# Patient Record
Sex: Female | Born: 1990 | Race: Black or African American | Hispanic: No | Marital: Single | Smoking: Former smoker
Health system: Southern US, Community
[De-identification: ages and names within clinical notes are randomized; demographics above are authoritative.]

## PROBLEM LIST (undated history)

## (undated) DIAGNOSIS — G589 Mononeuropathy, unspecified: Secondary | ICD-10-CM

## (undated) DIAGNOSIS — F329 Major depressive disorder, single episode, unspecified: Secondary | ICD-10-CM

## (undated) DIAGNOSIS — F410 Panic disorder [episodic paroxysmal anxiety] without agoraphobia: Secondary | ICD-10-CM

## (undated) DIAGNOSIS — F32A Depression, unspecified: Secondary | ICD-10-CM

## (undated) DIAGNOSIS — F4310 Post-traumatic stress disorder, unspecified: Secondary | ICD-10-CM

## (undated) DIAGNOSIS — F419 Anxiety disorder, unspecified: Secondary | ICD-10-CM

## (undated) DIAGNOSIS — G4700 Insomnia, unspecified: Secondary | ICD-10-CM

## (undated) HISTORY — PX: THERAPEUTIC ABORTION: SHX798

---

## 2014-12-26 ENCOUNTER — Encounter (HOSPITAL_BASED_OUTPATIENT_CLINIC_OR_DEPARTMENT_OTHER): Payer: Self-pay | Admitting: *Deleted

## 2014-12-26 ENCOUNTER — Emergency Department (HOSPITAL_BASED_OUTPATIENT_CLINIC_OR_DEPARTMENT_OTHER)
Admission: EM | Admit: 2014-12-26 | Discharge: 2014-12-27 | Disposition: A | Discharge status: Home / Self Care | Payer: Medicaid Other | Attending: Emergency Medicine | Admitting: Emergency Medicine

## 2014-12-26 DIAGNOSIS — Z720 Tobacco use: Secondary | ICD-10-CM | POA: Insufficient documentation

## 2014-12-26 DIAGNOSIS — Z8659 Personal history of other mental and behavioral disorders: Secondary | ICD-10-CM | POA: Insufficient documentation

## 2014-12-26 DIAGNOSIS — K529 Noninfective gastroenteritis and colitis, unspecified: Secondary | ICD-10-CM | POA: Diagnosis not present

## 2014-12-26 DIAGNOSIS — R1010 Upper abdominal pain, unspecified: Secondary | ICD-10-CM | POA: Diagnosis present

## 2014-12-26 HISTORY — DX: Post-traumatic stress disorder, unspecified: F43.10

## 2014-12-26 HISTORY — DX: Panic disorder (episodic paroxysmal anxiety): F41.0

## 2014-12-26 MED ORDER — ONDANSETRON HCL 4 MG/2ML IJ SOLN
4.0000 mg | Freq: Once | INTRAMUSCULAR | Status: AC
  Administered 2014-12-26: 4 mg via INTRAVENOUS
  Filled 2014-12-26: qty 2

## 2014-12-26 MED ORDER — SODIUM CHLORIDE 0.9 % IV BOLUS (SEPSIS)
1000.0000 mL | Freq: Once | INTRAVENOUS | Status: AC
  Administered 2014-12-26: 1000 mL via INTRAVENOUS

## 2014-12-26 NOTE — ED Provider Notes (Signed)
CSN: [id number]     Arrival date & time 12/26/14  1850 History  This chart was scribed for Rolan BuccoMelanie Quatisha Zylka, MD by Richarda Overlieichard Holland, ED Scribe. This patient was seen in room MH09/MH09 and the patient's care was started 10:26 PM.    Chief Complaint  Patient presents with  . Abdominal Pain   HPI HPI Comments: Jane Black is a 24 y.o. female with a history of panic attacks who presents to the Emergency Department complaining of upper abdominal pain that started at 7AM this morning. Pt reports associated nausea, vomiting and diarrhea. 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 Pt reports she is nauseated currently. She states her period started this morning and says she normally does not have pain with her periods. She denies any abdominal surgeries. She denies fever.    Past Medical History  Diagnosis Date  . PTSD (post-traumatic stress disorder)   . Panic attacks    History reviewed. No pertinent past surgical history. No family history on file. History  Substance Use Topics  . Smoking status: Current Some Day Smoker -- 1 years    Types: Cigars  . Smokeless tobacco: Not on file  . Alcohol Use: Not on file   OB History    No data available     Review of Systems  Constitutional: Positive for chills. Negative for fever, diaphoresis and fatigue.  HENT: Negative for congestion, rhinorrhea and sneezing.   Eyes: Negative.   Respiratory: Negative for cough, chest tightness and shortness of breath.   Cardiovascular: Negative for chest pain and leg swelling.  Gastrointestinal: Positive for nausea, vomiting, abdominal pain and diarrhea. Negative for blood in stool.  Genitourinary: Positive for vaginal bleeding (she is on her period now). Negative for frequency, hematuria, flank pain and difficulty urinating.  Musculoskeletal: Negative for back pain and arthralgias.  Skin: Negative for rash.  Neurological: Negative for dizziness, speech difficulty,  weakness, numbness and headaches.  All other systems reviewed and are negative.     Allergies  Review of patient's allergies indicates no known allergies.  Home Medications   Prior to Admission medications   Medication Sig Start Date End Date Taking? Authorizing Provider  ondansetron (ZOFRAN ODT) 4 MG disintegrating tablet 4mg  ODT q4 hours prn nausea/vomit 12/27/14   Rolan BuccoMelanie Shankar Silber, MD   BP 130/69 mmHg  Pulse 62  Temp(Src) 98.4 F (36.9 C) (Oral)  Resp 16  Ht 5\' 6"  (1.676 m)  Wt 190 lb (86.183 kg)  BMI 30.68 kg/m2  SpO2 100%  LMP 12/26/2014 Physical Exam  Constitutional: She is oriented to person, place, and time. She appears well-developed and well-nourished.  HENT:  Head: Normocephalic and atraumatic.  Eyes: Pupils are equal, round, and reactive to light. Right eye exhibits no discharge. Left eye exhibits no discharge.  Neck: Normal range of motion. Neck supple. No tracheal deviation present.  Cardiovascular: Normal rate, regular rhythm and normal heart sounds.   Pulmonary/Chest: Effort normal and breath sounds normal. No respiratory distress. She has no wheezes. She has no rales. She exhibits no tenderness.  Abdominal: Soft. Bowel sounds are normal. She exhibits no distension. There is tenderness (Mild diffuse tenderness). There is no rebound and no guarding.  Musculoskeletal: Normal range of motion. She exhibits no edema.  Lymphadenopathy:    She has no cervical adenopathy.  Neurological: She is alert and oriented to person, place, and time.  Skin: Skin is warm and dry. No rash noted.  Psychiatric: She has  a normal mood and affect. Her behavior is normal.  Nursing note and vitals reviewed.   ED Course  Procedures   DIAGNOSTIC STUDIES: Oxygen Saturation is 100% on RA, normal by my interpretation.    COORDINATION OF CARE: 10:31 PM Discussed treatment plan with pt at bedside and pt agreed to plan.   Labs Review Labs Reviewed  URINALYSIS, ROUTINE W REFLEX  MICROSCOPIC  PREGNANCY, URINE    Imaging Review No results found.   EKG Interpretation None      MDM   Final diagnoses:  Gastroenteritis   Patient is given IV fluids and Zofran. She is feeling much better after this. Her abdomen exam is non-concerning. She's completely nontender on exam now. She has no ongoing vomiting. She's tolerating by mouth fluids. Her symptoms are consistent with a viral gastroenteritis. The labs are not crossing over but her urine pregnancy was negative and her urinalysis did not show evidence of infection. There was some red cells in her urine which is consistent with her being on her period. She was discharged home with a prescription for some Zofran. She was advised to return if she has worsening symptoms.  I personally performed the services described in this documentation, which was scribed in my presence.  The recorded information has been reviewed and considered.      Rolan Bucco, MD 12/27/14 [id number]

## 2014-12-26 NOTE — ED Notes (Signed)
C/o upper abd pain with n/v/d. Onset this am with chills and sweats.

## 2014-12-27 MED ORDER — ONDANSETRON 4 MG PO TBDP: ORAL_TABLET | ORAL | Status: DC

## 2014-12-27 NOTE — Discharge Instructions (Signed)

## 2014-12-27 NOTE — ED Notes (Signed)
Dr. Belfi at BS 

## 2015-03-13 ENCOUNTER — Encounter (HOSPITAL_BASED_OUTPATIENT_CLINIC_OR_DEPARTMENT_OTHER): Payer: Self-pay | Admitting: *Deleted

## 2015-03-13 ENCOUNTER — Emergency Department (HOSPITAL_BASED_OUTPATIENT_CLINIC_OR_DEPARTMENT_OTHER)
Admission: EM | Admit: 2015-03-13 | Discharge: 2015-03-13 | Disposition: A | Discharge status: Home / Self Care | Payer: Medicaid Other | Attending: Emergency Medicine | Admitting: Emergency Medicine

## 2015-03-13 ENCOUNTER — Emergency Department (HOSPITAL_BASED_OUTPATIENT_CLINIC_OR_DEPARTMENT_OTHER): Payer: Medicaid Other

## 2015-03-13 DIAGNOSIS — Z720 Tobacco use: Secondary | ICD-10-CM | POA: Insufficient documentation

## 2015-03-13 DIAGNOSIS — N939 Abnormal uterine and vaginal bleeding, unspecified: Secondary | ICD-10-CM | POA: Diagnosis not present

## 2015-03-13 DIAGNOSIS — Z8659 Personal history of other mental and behavioral disorders: Secondary | ICD-10-CM | POA: Insufficient documentation

## 2015-03-13 HISTORY — DX: Anxiety disorder, unspecified: F41.9

## 2015-03-13 LAB — HCG, QUANTITATIVE, PREGNANCY: HCG, BETA CHAIN, QUANT, S: 949 m[IU]/mL — AB (ref <5)

## 2015-03-13 LAB — CBC WITH DIFFERENTIAL/PLATELET
Basophils Absolute: 0 10*3/uL (ref 0.0–0.1)
Basophils Relative: 0 % (ref 0–1)
Eosinophils Absolute: 0 10*3/uL (ref 0.0–0.7)
Eosinophils Relative: 0 % (ref 0–5)
HEMATOCRIT: 33.8 % — AB (ref 36.0–46.0)
HEMOGLOBIN: 11 g/dL — AB (ref 12.0–15.0)
LYMPHS ABS: 2.6 10*3/uL (ref 0.7–4.0)
Lymphocytes Relative: 26 % (ref 12–46)
MCH: 30.2 pg (ref 26.0–34.0)
MCHC: 32.5 g/dL (ref 30.0–36.0)
MCV: 92.9 fL (ref 78.0–100.0)
Monocytes Absolute: 0.5 10*3/uL (ref 0.1–1.0)
Monocytes Relative: 5 % (ref 3–12)
Neutro Abs: 6.8 10*3/uL (ref 1.7–7.7)
Neutrophils Relative %: 69 % (ref 43–77)
PLATELETS: 287 10*3/uL (ref 150–400)
RBC: 3.64 MIL/uL — AB (ref 3.87–5.11)
RDW: 13.6 % (ref 11.5–15.5)
WBC: 9.9 10*3/uL (ref 4.0–10.5)

## 2015-03-13 LAB — BASIC METABOLIC PANEL
ANION GAP: 7 (ref 5–15)
BUN: 7 mg/dL (ref 6–20)
CO2: 26 mmol/L (ref 22–32)
CREATININE: 0.48 mg/dL (ref 0.44–1.00)
Calcium: 8.8 mg/dL — ABNORMAL LOW (ref 8.9–10.3)
Chloride: 105 mmol/L (ref 101–111)
GFR calc non Af Amer: >60 mL/min (ref >60)
Glucose, Bld: 92 mg/dL (ref 65–99)
POTASSIUM: 3.7 mmol/L (ref 3.5–5.1)
Sodium: 138 mmol/L (ref 135–145)

## 2015-03-13 MED ORDER — IBUPROFEN 800 MG PO TABS
800.0000 mg | ORAL_TABLET | Freq: Once | ORAL | Status: AC
  Administered 2015-03-13: 800 mg via ORAL
  Filled 2015-03-13: qty 1

## 2015-03-13 NOTE — ED Provider Notes (Signed)
CSN: [id number]     Arrival date & time 03/13/15  4098 History   First MD Initiated Contact with Patient 03/13/15 1919     Chief Complaint  Patient presents with  . Vaginal Bleeding    Patient is a 24 y.o. female presenting with vaginal bleeding. The history is provided by the patient.  Vaginal Bleeding Severity:  Moderate Onset quality:  Gradual Duration:  1 day Timing:  Intermittent Progression:  Worsening Chronicity:  New Relieved by:  Nothing Worsened by:  Nothing tried Associated symptoms: abdominal pain   Associated symptoms: no fever   Risk factors comment:  Recent elective abortion pt reports elective abortion last week She reports over past day she has had increased vag bleeding No fever/vomiting reported  Pt reports she was approximately 12 weeks She reports procedure was in [place] and she has no f/u arranged there Past Medical History  Diagnosis Date  . PTSD (post-traumatic stress disorder)   . Panic attacks   . Anxiety    Past Surgical History  Procedure Laterality Date  . Therapeutic abortion     No family history on file. History  Substance Use Topics  . Smoking status: Current Some Day Smoker -- 1 years    Types: Cigars  . Smokeless tobacco: Never Used  . Alcohol Use: Yes     Comment: rare   OB History    No data available     Review of Systems  Constitutional: Negative for fever.  Gastrointestinal: Positive for abdominal pain. Negative for vomiting.  Genitourinary: Positive for vaginal bleeding.  Neurological: Negative for weakness.  All other systems reviewed and are negative.     Allergies  Review of patient's allergies indicates no known allergies.  Home Medications   Prior to Admission medications   Medication Sig Start Date End Date Taking? Authorizing Provider  ondansetron (ZOFRAN ODT) 4 MG disintegrating tablet  ODT q4 hours prn nausea/vomit 12/27/14   Rolan Bucco, MD   BP 136/81 mmHg  Pulse 93  Temp(Src) 98.6 F (37 C)  (Oral)  Resp 18  Ht  (1.676 m)  Wt 173 lb (78.472 kg)  BMI 27.94 kg/m2  SpO2 100% Physical Exam CONSTITUTIONAL: Well developed/well nourished HEAD: Normocephalic/atraumatic EYES: EOMI/PERRL ENMT: Mucous membranes moist NECK: supple no meningeal signs SPINE/BACK:entire spine nontender CV: S1/S2 noted, no murmurs/rubs/gallops noted LUNGS: Lungs are clear to auscultation bilaterally, no apparent distress ABDOMEN: soft, nontender, no rebound or guarding, bowel sounds noted throughout abdomen GU:no cva tenderness. Vaginal bleeding noted.  No products of conception noted.  Cervical os closed.  Chaperone present (nurse Kaila) NEURO: Pt is awake/alert/appropriate, moves all extremitiesx4.  No facial droop.   EXTREMITIES: pulses normal/equal, full ROM SKIN: warm, color normal PSYCH: no abnormalities of mood noted, alert and oriented to situation  ED Course  Procedures  8:35 PM Will need pelvic US to r/o retained products of conception 9:54 PM PT IMPROVED SHE IS AMBULATING, NO DISTRESS DISCUSSED US FINDINGS WITH HER OBYN DR DORN RECOMMENDS F/U IN OFFICE THIS WEEK FOR REPEAT HCG LEVELS AT THIS TIME, DOES NOT REQUIRE EMERGENT OPERATIVE MANAGEMENT DISCUSSED STRICT RETURN PRECAUTIONS  Labs Review Labs Reviewed  BASIC METABOLIC PANEL - Abnormal; Notable for the following:    Calcium 8.8 (*)    All other components within normal limits  CBC WITH DIFFERENTIAL/PLATELET - Abnormal; Notable for the following:    RBC 3.64 (*)    Hemoglobin 11.0 (*)    HCT 33.8 (*)    All other components  within normal limits  HCG, QUANTITATIVE, PREGNANCY - Abnormal; Notable for the following:    hCG, Beta Chain, Quant, S 949 (*)    All other components within normal limits    Imaging Review [place] Transvaginal Non-ob  03/13/2015   CLINICAL DATA:  Initial evaluation for heavy vaginal bleeding status post recent elective abortion.  EXAM: TRANSABDOMINAL AND TRANSVAGINAL ULTRASOUND OF PELVIS  TECHNIQUE:  Both transabdominal and transvaginal ultrasound examinations of the pelvis were performed. Transabdominal technique was performed for global imaging of the pelvis including uterus, ovaries, adnexal regions, and pelvic cul-de-sac. It was necessary to proceed with endovaginal exam following the transabdominal exam to visualize the uterus and ovaries.  COMPARISON:  None  FINDINGS: Uterus  Measurements: 8.0 x 5.0 x 5.9 cm. Uterus is retroverted. No fibroids or focal masses identified.  Endometrium  Thickness: 2.1 cm. There is heterogeneous predominantly hypoechoic complex material within the endometrial canal. No definite internal vascularity seen with color Doppler imaging.  Right ovary  Measurements: 2.7 x 2.0 x 2.2 cm. Normal appearance/no adnexal mass. Normal 1.4 x 1.1 x 1.2 cm follicle noted.  Left ovary  Measurements: 2.4 x 1.3 x 1.6 cm. Normal appearance/no adnexal mass.  Other findings  No free fluid.  IMPRESSION: 1. Complex heterogeneous predominantly hypoechoic material within the endometrial canal without internal vascularity. Given the imaging appearance and history vaginal bleeding, finding is favored to most likely reflect retained blood products. However, while no associated internal vascularity is identified, possible retained products of conception is not entirely excluded. Correlation with serial beta HCG levels and gynecologic consultation recommended. 2. Normal sonographic appearance of the ovaries.   Electronically Signed   By: Rise MuBenjamin  McClintock M.D.   On: 03/13/2015 21:23   [place] Pelvis Complete  03/13/2015   CLINICAL DATA:  Initial evaluation for heavy vaginal bleeding status post recent elective abortion.  EXAM: TRANSABDOMINAL AND TRANSVAGINAL ULTRASOUND OF PELVIS  TECHNIQUE: Both transabdominal and transvaginal ultrasound examinations of the pelvis were performed. Transabdominal technique was performed for global imaging of the pelvis including uterus, ovaries, adnexal regions, and pelvic  cul-de-sac. It was necessary to proceed with endovaginal exam following the transabdominal exam to visualize the uterus and ovaries.  COMPARISON:  None  FINDINGS: Uterus  Measurements: 8.0 x 5.0 x 5.9 cm. Uterus is retroverted. No fibroids or focal masses identified.  Endometrium  Thickness: 2.1 cm. There is heterogeneous predominantly hypoechoic complex material within the endometrial canal. No definite internal vascularity seen with color Doppler imaging.  Right ovary  Measurements: 2.7 x 2.0 x 2.2 cm. Normal appearance/no adnexal mass. Normal 1.4 x 1.1 x 1.2 cm follicle noted.  Left ovary  Measurements: 2.4 x 1.3 x 1.6 cm. Normal appearance/no adnexal mass.  Other findings  No free fluid.  IMPRESSION: 1. Complex heterogeneous predominantly hypoechoic material within the endometrial canal without internal vascularity. Given the imaging appearance and history vaginal bleeding, finding is favored to most likely reflect retained blood products. However, while no associated internal vascularity is identified, possible retained products of conception is not entirely excluded. Correlation with serial beta HCG levels and gynecologic consultation recommended. 2. Normal sonographic appearance of the ovaries.   Electronically Signed   By: Rise MuBenjamin  McClintock M.D.   On: 03/13/2015 21:23      MDM   Final diagnoses:  Vaginal bleeding    Nursing notes including past medical history and social history reviewed and considered in documentation Labs/vital reviewed myself and considered during evaluation     Zadie Rhineonald Loetta Connelley, MD 03/13/15  2157 

## 2015-03-13 NOTE — Discharge Instructions (Signed)
[facility] AND FOLLOWUP IN 2 DAYS FOR REPEAT BLOOD HCG LEVELS (CURRENT LEVEL IS 949)   Abnormal Uterine Bleeding Abnormal uterine bleeding can affect women at various stages in life, including teenagers, women in their reproductive years, pregnant women, and women who have reached menopause. Several kinds of uterine bleeding are considered abnormal, including:  Bleeding or spotting between periods.   Bleeding after sexual intercourse.   Bleeding that is heavier or more than normal.   Periods that last longer than usual.  Bleeding after menopause.  Many cases of abnormal uterine bleeding are minor and simple to treat, while others are more serious. Any type of abnormal bleeding should be evaluated by your health care provider. Treatment will depend on the cause of the bleeding. HOME CARE INSTRUCTIONS Monitor your condition for any changes. The following actions may help to alleviate any discomfort you are experiencing:  Avoid the use of tampons and douches as directed by your health care provider.  Change your pads frequently. You should get regular pelvic exams and Pap tests. Keep all follow-up appointments for diagnostic tests as directed by your health care provider.  SEEK MEDICAL CARE IF:   Your bleeding lasts more than 1 week.   You feel dizzy at times.  SEEK IMMEDIATE MEDICAL CARE IF:   You pass out.   You are changing pads every 15 to 30 minutes.   You have abdominal pain.  You have a fever.   You become sweaty or weak.   You are passing large blood clots from the vagina.   You start to feel nauseous and vomit. MAKE SURE YOU:   Understand these instructions.  Will watch your condition.  Will get help right away if you are not doing well or get worse. Document Released: 10/08/2005 Document Revised: 10/13/2013 Document Reviewed: 05/07/2013 [facility] Patient Information 2015 [employer] [place]. This information is not intended to replace advice  given to you by your health care provider. Make sure you discuss any questions you have with your health care provider.

## 2015-03-13 NOTE — ED Notes (Signed)
Pt reports she had surgical abortion on Thursday- states she has been passing blood clots since last night

## 2015-03-13 NOTE — ED Notes (Signed)
Pelvic cart at bedside. 

## 2016-09-18 ENCOUNTER — Encounter (HOSPITAL_BASED_OUTPATIENT_CLINIC_OR_DEPARTMENT_OTHER): Payer: Self-pay | Admitting: Emergency Medicine

## 2016-09-18 ENCOUNTER — Emergency Department (HOSPITAL_BASED_OUTPATIENT_CLINIC_OR_DEPARTMENT_OTHER)
Admission: EM | Admit: 2016-09-18 | Discharge: 2016-09-18 | Disposition: A | Discharge status: Home / Self Care | Payer: Medicaid Other | Attending: Emergency Medicine | Admitting: Emergency Medicine

## 2016-09-18 DIAGNOSIS — F410 Panic disorder [episodic paroxysmal anxiety] without agoraphobia: Secondary | ICD-10-CM | POA: Insufficient documentation

## 2016-09-18 DIAGNOSIS — F17290 Nicotine dependence, other tobacco product, uncomplicated: Secondary | ICD-10-CM | POA: Insufficient documentation

## 2016-09-18 HISTORY — DX: Depression, unspecified: F32.A

## 2016-09-18 HISTORY — DX: Mononeuropathy, unspecified: G58.9

## 2016-09-18 HISTORY — DX: Insomnia, unspecified: G47.00

## 2016-09-18 HISTORY — DX: Major depressive disorder, single episode, unspecified: F32.9

## 2016-09-18 MED ORDER — HYDROXYZINE HCL 25 MG PO TABS: 25.0000 mg | ORAL_TABLET | Freq: Four times a day (QID) | ORAL | 0 refills | Status: DC

## 2016-09-18 MED ORDER — LORAZEPAM 1 MG PO TABS
1.0000 mg | ORAL_TABLET | Freq: Once | ORAL | Status: AC
  Administered 2016-09-18: 1 mg via ORAL
  Filled 2016-09-18: qty 1

## 2016-09-18 MED FILL — hydrOXYzine HCL 25 MG TABS: 25 | 3 days supply | Qty: 12 | Fill #0

## 2016-09-18 NOTE — ED Provider Notes (Signed)
[facility]   CSN: [id number] Arrival date & time: 09/18/16  1116     History   Chief Complaint Chief Complaint  Patient presents with  . Anxiety    HPI Jane Black is a 25 y.o. female.  HPI Jane Black is a 25 y.o. female with PMH significant for anxiety, depression, panic attacks, PTSD who presents with anxiety. Patient states she experienced one of her typical panic attacks yesterday after work that has since resolved. She states it may be due to decreased sleep. She states she has a history of insomnia as well, and has not been able to sleep quite as well since her work requires her to wake up at 5 AM. She has otherwise been in her usual state of health. She denies any suicidal or homicidal ideations. She used to be on daily medication for her anxiety, but this was stopped when she became pregnant, and she is not restarted any maintenance medications. She states her last panic attack was about 2 years ago when she was pregnant. She denies any drug or alcohol use. She denies any chest pain, fever, shortness of breath, cough, or abdominal pain.   Past Medical History:  Diagnosis Date  . Anxiety   . Depression   . Insomnia   . Nerve disorder   . Panic attacks   . PTSD (post-traumatic stress disorder)     There are no active problems to display for this patient.   Past Surgical History:  Procedure Laterality Date  . THERAPEUTIC ABORTION      OB History    No data available       Home Medications    Prior to Admission medications   Medication Sig Start Date End Date Taking? Authorizing Provider  hydrOXYzine (ATARAX/VISTARIL) 25 MG tablet Take 1 tablet (25 mg total) by mouth every 6 (six) hours. 09/18/16   Cheri FowlerKayla Slyvia Lartigue, PA-C  ondansetron (ZOFRAN ODT) 4 MG disintegrating tablet 4mg  ODT q4 hours prn nausea/vomit 12/27/14   Rolan BuccoMelanie Belfi, MD    Family History History reviewed. No pertinent family history.  Social History Social  History  Substance Use Topics  . Smoking status: Current Some Day Smoker    Years: 1.00    Types: Cigars  . Smokeless tobacco: Never Used  . Alcohol use Yes     Comment: rare     Allergies   Patient has no known allergies.   Review of Systems Review of Systems All other systems negative unless otherwise stated in HPI   Physical Exam Updated Vital Signs BP 122/77 (BP Location: Left Arm)   Pulse 72   Temp 98.2 F (36.8 C) (Oral)   Resp 18   Ht 5\' 7"  (1.702 m)   Wt 85.7 kg   LMP 09/16/2016 (Exact Date)   SpO2 100%   BMI 29.60 kg/m   Physical Exam  Constitutional: She is oriented to person, place, and time. She appears well-developed and well-nourished.  Non-toxic appearance. She does not have a sickly appearance. She does not appear ill.  HENT:  Head: Normocephalic and atraumatic.  Mouth/Throat: Oropharynx is clear and moist.  Eyes: Conjunctivae are normal.  Neck: Normal range of motion. Neck supple.  Cardiovascular: Normal rate, regular rhythm and normal heart sounds.   Pulmonary/Chest: Effort normal and breath sounds normal. No accessory muscle usage or stridor. No respiratory distress. She has no wheezes. She has no rhonchi. She has no rales.  Abdominal: Soft. Bowel sounds are normal. She exhibits no  distension. There is no tenderness.  Musculoskeletal: Normal range of motion.  Lymphadenopathy:    She has no cervical adenopathy.  Neurological: She is alert and oriented to person, place, and time.  Speech clear without dysarthria.  Skin: Skin is warm and dry.  Psychiatric: She has a normal mood and affect. Her behavior is normal.     ED Treatments / Results  Labs (all labs ordered are listed, but only abnormal results are displayed) Labs Reviewed - No data to display  EKG  EKG Interpretation None       Radiology No results found.  Procedures Procedures (including critical care time)  Medications Ordered in ED Medications  LORazepam (ATIVAN)  tablet 1 mg (1 mg Oral Given 09/18/16 1213)     Initial Impression / Assessment and Plan / ED Course  I have reviewed the triage vital signs and the nursing notes.  Pertinent labs & imaging results that were available during my care of the patient were reviewed by me and considered in my medical decision making (see chart for details).  Clinical Course    Patient presents with her typical panic attack and anxiety. No systemic symptoms. She is in her usual state of health. She expresses no suicidal or homicidal ideations. I do not suspect an emergent cause. Patient treated in ED with by mouth Ativan. Discharged home with Atarax. Encouraged patient to follow up with her behavior health team. Return precautions discussed. Stable for discharge.  Final Clinical Impressions(s) / ED Diagnoses   Final diagnoses:  Panic attack    New Prescriptions Discharge Medication List as of 09/18/2016 12:26 PM    START taking these medications   Details  hydrOXYzine (ATARAX/VISTARIL) 25 MG tablet Take 1 tablet (25 mg total) by mouth every 6 (six) hours., Starting Tue 09/18/2016, Print         Cheri FowlerKayla Jase Reep, PA-C 09/18/16 1653    Azalia BilisKevin Campos, MD 09/20/16 [phone]

## 2016-09-18 NOTE — Discharge Instructions (Signed)
Please follow up with your primary care/behavioral health provider to possibly get restarted on a daily anxiety medication.  Return to the ED for sudden worsening symptoms, chest pain, shortness of breath, fever, or any new or concerning symptoms.

## 2016-09-18 NOTE — ED Triage Notes (Signed)
Pt sts "I'm having anxiety"; sts started yesterday during/after work; denies specific trigger

## 2016-09-18 NOTE — ED Notes (Signed)
Pt d/c home with ride. Work note given. Directed to pharmacy to pick up Rx. Ambulatory to d/c window with steady gait

## 2017-05-21 ENCOUNTER — Encounter (HOSPITAL_BASED_OUTPATIENT_CLINIC_OR_DEPARTMENT_OTHER): Payer: Self-pay | Admitting: Emergency Medicine

## 2017-05-21 ENCOUNTER — Emergency Department (HOSPITAL_BASED_OUTPATIENT_CLINIC_OR_DEPARTMENT_OTHER)
Admission: EM | Admit: 2017-05-21 | Discharge: 2017-05-21 | Disposition: A | Discharge status: Home / Self Care | Payer: Medicaid Other | Attending: Emergency Medicine | Admitting: Emergency Medicine

## 2017-05-21 DIAGNOSIS — M779 Enthesopathy, unspecified: Secondary | ICD-10-CM

## 2017-05-21 DIAGNOSIS — M778 Other enthesopathies, not elsewhere classified: Secondary | ICD-10-CM | POA: Insufficient documentation

## 2017-05-21 DIAGNOSIS — Z79899 Other long term (current) drug therapy: Secondary | ICD-10-CM | POA: Insufficient documentation

## 2017-05-21 DIAGNOSIS — F17210 Nicotine dependence, cigarettes, uncomplicated: Secondary | ICD-10-CM | POA: Insufficient documentation

## 2017-05-21 MED ORDER — IBUPROFEN 600 MG PO TABS: 600.0000 mg | ORAL_TABLET | Freq: Four times a day (QID) | ORAL | 0 refills | Status: DC | PRN

## 2017-05-21 NOTE — ED Triage Notes (Signed)
pateint reports that she started to have right hand pain yesterday. History of the same when she was pregnant 2 -3 years ago

## 2017-05-21 NOTE — ED Notes (Signed)
ED Provider at bedside. 

## 2017-05-21 NOTE — Discharge Instructions (Signed)
Wear wrist splint particularly during activities.  Try to elevate and ice her wrist and hand several times per day.  Take ibuprofen for inflammation and pain.  Follow-up with a hand specialist (name provided in discharge instructions) if symptoms are not improving with 1-2 weeks of treatment.

## 2017-05-21 NOTE — ED Provider Notes (Signed)
[facility] Provider [facility]   CSN: [id number] Arrival date & time: 05/21/17  1629     History   Chief Complaint Chief Complaint  Patient presents with  . Hand Pain    HPI Jane Black is a 26 y.o. female.  HPI Patient has had problems with right handed palmnar pain and wrist pain since being pregnant about 3 years ago. She reports that at that time she was told it was a tendinitis. It never really resolved. 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 She illustrates a motion that requires her to rapidly pronate and supinate while holding an ice cream container. The patient reports that the other problem she has with that hand, is that it will cramp up and she has to manually open it if she has closed into a fist to grip something . She states that happens if she writes for very long or sometimes while she is brushing her hair. This does not happen in the opposite hand. She does not have other cramping. She reports that she does not think she is pregnant. LMP was last week. Review systems otherwise negative. Past Medical History:  Diagnosis Date  . Anxiety   . Depression   . Insomnia   . Nerve disorder   . Panic attacks   . PTSD (post-traumatic stress disorder)     There are no active problems to display for this patient.   Past Surgical History:  Procedure Laterality Date  . THERAPEUTIC ABORTION      OB History    No data available       Home Medications    Prior to Admission medications   Medication Sig Start Date End Date Taking? Authorizing Provider  hydrOXYzine (ATARAX/VISTARIL) 25 MG tablet Take 1 tablet (25 mg total) by mouth every 6 (six) hours. 09/18/16   Cheri Fowlerose, Kayla, PA-C  ibuprofen (ADVIL,MOTRIN) 600 MG tablet Take 1 tablet (600 mg total) by mouth every 6 (six) hours as needed. 05/21/17   Arby BarrettePfeiffer, Shawnmichael Parenteau, MD  ondansetron (ZOFRAN ODT) 4 MG disintegrating tablet 4mg  ODT q4 hours prn nausea/vomit 12/27/14    Rolan BuccoBelfi, Melanie, MD    Family History History reviewed. No pertinent family history.  Social History Social History  Substance Use Topics  . Smoking status: Current Some Day Smoker    Years: 1.00    Types: Cigars  . Smokeless tobacco: Never Used  . Alcohol use Yes     Comment: rare     Allergies   Patient has no known allergies.   Review of Systems Review of Systems 10 Systems reviewed and are negative for acute change except as noted in the HPI.   Physical Exam Updated Vital Signs BP 133/79 (BP Location: Left Arm)   Pulse 97   Temp 99.3 F (37.4 C) (Oral)   Resp 18   Ht 5\' 6"  (1.676 m)   Wt 79.4 kg (175 lb)   LMP 05/14/2017   SpO2 100%   BMI 28.25 kg/m   Physical Exam  Constitutional: She is oriented to person, place, and time. She appears well-developed and well-nourished. No distress.  Patient is clinically well and appearance. She is well-nourished well-developed. Mildly overweight but not significant obesity.  HENT:  Head: Normocephalic and atraumatic.  Eyes: EOM are normal.  Pulmonary/Chest: Effort normal.  Musculoskeletal: Normal range of motion. She exhibits no edema, tenderness or deformity.  Patient's right hand and upper extremity do not have any objective swelling. Patient is  nontender at the elbow with normal range of motion. No forearm tenderness. Wrist is normal in appearance. Patient has not has significant tenderness over the carpal tunnel. Patient's only reproducible tenderness is with lateral compression of the palm by squeezing the medical carpals together. Patient has normal range of motion of the digits. Patient has normal resistance against forced flexion and extension. Patient does not have pain with forced extension of the wrist.  Neurological: She is alert and oriented to person, place, and time. No cranial nerve deficit. She exhibits normal muscle tone. Coordination normal.  Skin: Skin is warm and dry.  Psychiatric: She has a normal mood  and affect.     ED Treatments / Results  Labs (all labs ordered are listed, but only abnormal results are displayed) Labs Reviewed - No data to display  EKG  EKG Interpretation None       Radiology No results found.  Procedures Procedures (including critical care time)  Medications Ordered in ED Medications - No data to display   Initial Impression / Assessment and Plan / ED Course  I have reviewed the triage vital signs and the nursing notes.  Pertinent labs & imaging results that were available during my care of the patient were reviewed by me and considered in my medical decision making (see chart for details).     Final Clinical Impressions(s) / ED Diagnoses   Final diagnoses:  Right hand tendonitis   At this time, patient does not have significant objective findings regarding her symptoms. She has normal range of motion of the digits without any contraction. She does have intact normal range of motion. She does have intact strength of all tendon testing of the hands. 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 At this time will recommend she wears a wrist splint, elevate and ice is in the evenings and tries anti-inflammatory. He is counseled on the importance of follow-up with hand specialist in 1-2 weeks if symptoms are not significantly improved with conservative treatment. New Prescriptions New Prescriptions   IBUPROFEN (ADVIL,MOTRIN) 600 MG TABLET    Take 1 tablet (600 mg total) by mouth every 6 (six) hours as needed.     Arby BarrettePfeiffer, Iren Whipp, MD 05/21/17 1726

## 2018-01-05 ENCOUNTER — Emergency Department (HOSPITAL_BASED_OUTPATIENT_CLINIC_OR_DEPARTMENT_OTHER): Payer: Medicaid Other

## 2018-01-05 ENCOUNTER — Emergency Department (HOSPITAL_BASED_OUTPATIENT_CLINIC_OR_DEPARTMENT_OTHER)
Admission: EM | Admit: 2018-01-05 | Discharge: 2018-01-05 | Disposition: A | Discharge status: Home / Self Care | Payer: Medicaid Other | Attending: Emergency Medicine | Admitting: Emergency Medicine

## 2018-01-05 ENCOUNTER — Other Ambulatory Visit: Payer: Self-pay

## 2018-01-05 ENCOUNTER — Encounter (HOSPITAL_BASED_OUTPATIENT_CLINIC_OR_DEPARTMENT_OTHER): Payer: Self-pay | Admitting: *Deleted

## 2018-01-05 DIAGNOSIS — F17290 Nicotine dependence, other tobacco product, uncomplicated: Secondary | ICD-10-CM | POA: Insufficient documentation

## 2018-01-05 DIAGNOSIS — R102 Pelvic and perineal pain: Secondary | ICD-10-CM | POA: Diagnosis not present

## 2018-01-05 DIAGNOSIS — O23591 Infection of other part of genital tract in pregnancy, first trimester: Secondary | ICD-10-CM | POA: Insufficient documentation

## 2018-01-05 DIAGNOSIS — O99331 Smoking (tobacco) complicating pregnancy, first trimester: Secondary | ICD-10-CM | POA: Diagnosis not present

## 2018-01-05 DIAGNOSIS — B9689 Other specified bacterial agents as the cause of diseases classified elsewhere: Secondary | ICD-10-CM

## 2018-01-05 DIAGNOSIS — Z3A01 Less than 8 weeks gestation of pregnancy: Secondary | ICD-10-CM | POA: Diagnosis not present

## 2018-01-05 DIAGNOSIS — O9989 Other specified diseases and conditions complicating pregnancy, childbirth and the puerperium: Secondary | ICD-10-CM | POA: Diagnosis present

## 2018-01-05 DIAGNOSIS — A5901 Trichomonal vulvovaginitis: Secondary | ICD-10-CM | POA: Insufficient documentation

## 2018-01-05 DIAGNOSIS — O219 Vomiting of pregnancy, unspecified: Secondary | ICD-10-CM

## 2018-01-05 DIAGNOSIS — O218 Other vomiting complicating pregnancy: Secondary | ICD-10-CM | POA: Insufficient documentation

## 2018-01-05 DIAGNOSIS — N760 Acute vaginitis: Secondary | ICD-10-CM

## 2018-01-05 LAB — CBC WITH DIFFERENTIAL/PLATELET
Basophils Absolute: 0 10*3/uL (ref 0.0–0.1)
Basophils Relative: 0 %
EOS ABS: 0 10*3/uL (ref 0.0–0.7)
Eosinophils Relative: 1 %
HCT: 38.2 % (ref 36.0–46.0)
HEMOGLOBIN: 13.4 g/dL (ref 12.0–15.0)
LYMPHS ABS: 1.4 10*3/uL (ref 0.7–4.0)
LYMPHS PCT: 23 %
MCH: 30 pg (ref 26.0–34.0)
MCHC: 35.1 g/dL (ref 30.0–36.0)
MCV: 85.5 fL (ref 78.0–100.0)
Monocytes Absolute: 0.5 10*3/uL (ref 0.1–1.0)
Monocytes Relative: 9 %
NEUTROS PCT: 67 %
Neutro Abs: 4.1 10*3/uL (ref 1.7–7.7)
Platelets: 251 10*3/uL (ref 150–400)
RBC: 4.47 MIL/uL (ref 3.87–5.11)
RDW: 12.5 % (ref 11.5–15.5)
WBC: 6.1 10*3/uL (ref 4.0–10.5)

## 2018-01-05 LAB — COMPREHENSIVE METABOLIC PANEL
ALT: 17 U/L (ref 14–54)
AST: 18 U/L (ref 15–41)
Albumin: 4.4 g/dL (ref 3.5–5.0)
Alkaline Phosphatase: 73 U/L (ref 38–126)
Anion gap: 14 (ref 5–15)
BUN: 7 mg/dL (ref 6–20)
CO2: 20 mmol/L — AB (ref 22–32)
Calcium: 9.5 mg/dL (ref 8.9–10.3)
Chloride: 102 mmol/L (ref 101–111)
Creatinine, Ser: 0.58 mg/dL (ref 0.44–1.00)
GFR calc Af Amer: >60 mL/min (ref >60)
GFR calc non Af Amer: >60 mL/min (ref >60)
Glucose, Bld: 96 mg/dL (ref 65–99)
Potassium: 3.1 mmol/L — ABNORMAL LOW (ref 3.5–5.1)
SODIUM: 136 mmol/L (ref 135–145)
Total Bilirubin: 1 mg/dL (ref 0.3–1.2)
Total Protein: 7.8 g/dL (ref 6.5–8.1)

## 2018-01-05 LAB — HCG, QUANTITATIVE, PREGNANCY: HCG, BETA CHAIN, QUANT, S: 103820 m[IU]/mL — AB (ref <5)

## 2018-01-05 LAB — URINALYSIS, ROUTINE W REFLEX MICROSCOPIC
Glucose, UA: NEGATIVE mg/dL
Hgb urine dipstick: NEGATIVE
Ketones, ur: >80 mg/dL — AB
NITRITE: NEGATIVE
Protein, ur: 30 mg/dL — AB
Specific Gravity, Urine: >1.03 — ABNORMAL HIGH (ref 1.005–1.030)
pH: 6 (ref 5.0–8.0)

## 2018-01-05 LAB — URINALYSIS, MICROSCOPIC (REFLEX)

## 2018-01-05 LAB — WET PREP, GENITAL
Sperm: NONE SEEN
Yeast Wet Prep HPF POC: NONE SEEN

## 2018-01-05 LAB — LIPASE, BLOOD: Lipase: 18 U/L (ref 11–51)

## 2018-01-05 MED ORDER — METRONIDAZOLE 500 MG PO TABS
2000.0000 mg | ORAL_TABLET | Freq: Once | ORAL | Status: AC
  Administered 2018-01-05: 2000 mg via ORAL
  Filled 2018-01-05: qty 4

## 2018-01-05 MED ORDER — SODIUM CHLORIDE 0.9 % IV SOLN
INTRAVENOUS | Status: DC
  Administered 2018-01-05: 07:00:00 via INTRAVENOUS

## 2018-01-05 MED ORDER — POTASSIUM CHLORIDE CRYS ER 20 MEQ PO TBCR
40.0000 meq | EXTENDED_RELEASE_TABLET | Freq: Once | ORAL | Status: AC
  Administered 2018-01-05: 40 meq via ORAL
  Filled 2018-01-05: qty 2

## 2018-01-05 MED ORDER — SODIUM CHLORIDE 0.9 % IV BOLUS (SEPSIS)
1000.0000 mL | Freq: Once | INTRAVENOUS | Status: AC
  Administered 2018-01-05: 1000 mL via INTRAVENOUS

## 2018-01-05 MED ORDER — METRONIDAZOLE 500 MG PO TABS: 500.0000 mg | ORAL_TABLET | Freq: Two times a day (BID) | ORAL | 0 refills | Status: DC

## 2018-01-05 MED ORDER — ONDANSETRON HCL 4 MG/2ML IJ SOLN
4.0000 mg | Freq: Once | INTRAMUSCULAR | Status: AC
  Administered 2018-01-05: 4 mg via INTRAVENOUS
  Filled 2018-01-05: qty 2

## 2018-01-05 MED ORDER — DICYCLOMINE HCL 10 MG/ML IM SOLN
20.0000 mg | Freq: Once | INTRAMUSCULAR | Status: AC
  Administered 2018-01-05: 20 mg via INTRAMUSCULAR
  Filled 2018-01-05: qty 2

## 2018-01-05 MED ORDER — ACETAMINOPHEN 500 MG PO TABS
1000.0000 mg | ORAL_TABLET | Freq: Once | ORAL | Status: AC
  Administered 2018-01-05: 1000 mg via ORAL
  Filled 2018-01-05: qty 2

## 2018-01-05 MED ORDER — POTASSIUM CHLORIDE 10 MEQ/100ML IV SOLN
10.0000 meq | INTRAVENOUS | Status: AC
  Administered 2018-01-05 (×2): 10 meq via INTRAVENOUS
  Filled 2018-01-05 (×2): qty 100

## 2018-01-05 MED ORDER — LIDOCAINE HCL (PF) 1 % IJ SOLN
INTRAMUSCULAR | Status: AC
  Administered 2018-01-05: 0.9 mL
  Filled 2018-01-05: qty 5

## 2018-01-05 MED ORDER — AZITHROMYCIN 250 MG PO TABS
1000.0000 mg | ORAL_TABLET | Freq: Once | ORAL | Status: AC
  Administered 2018-01-05: 1000 mg via ORAL
  Filled 2018-01-05: qty 4

## 2018-01-05 MED ORDER — CEFTRIAXONE SODIUM 250 MG IJ SOLR
250.0000 mg | Freq: Once | INTRAMUSCULAR | Status: AC
  Administered 2018-01-05: 250 mg via INTRAMUSCULAR
  Filled 2018-01-05: qty 250

## 2018-01-05 MED ORDER — METRONIDAZOLE IN NACL 5-0.79 MG/ML-% IV SOLN
500.0000 mg | Freq: Once | INTRAVENOUS | Status: AC
  Administered 2018-01-05: 500 mg via INTRAVENOUS
  Filled 2018-01-05: qty 100

## 2018-01-05 MED ORDER — METOCLOPRAMIDE HCL 10 MG PO TABS: 10.0000 mg | ORAL_TABLET | Freq: Four times a day (QID) | ORAL | 0 refills | Status: DC

## 2018-01-05 MED ORDER — METOCLOPRAMIDE HCL 5 MG/ML IJ SOLN
10.0000 mg | Freq: Once | INTRAMUSCULAR | Status: AC
  Administered 2018-01-05: 10 mg via INTRAVENOUS
  Filled 2018-01-05: qty 2

## 2018-01-05 NOTE — ED Notes (Signed)
ED Provider at bedside. 

## 2018-01-05 NOTE — ED Triage Notes (Signed)
Pt states she is 2 months pregnant and has been throwing up a lot. Feels she is dehydrated. [facility].

## 2018-01-05 NOTE — Discharge Instructions (Signed)
You may take Tylenol 1000 mg every 6 hours as needed for pain.  Please follow-up closely with your OB/GYN Dr. Shawnie Ponsorn.  I recommend that you drink between 60-80 ounces of water a day.

## 2018-01-05 NOTE — ED Provider Notes (Signed)
10:30 AM pt signed out to me at beginning of shift.  Pelvic ultrasound obtained and is reassuring, showing IUP at 6 weeks 5 days.  Pt is able to tolerate po fluids now.  She will call her OB tomorrow to schedule followup appointment.     Phillis HaggisMabe, Martha L, MD 01/05/18 1031

## 2018-01-05 NOTE — ED Provider Notes (Addendum)
TIME SEEN: 5:19 AM  CHIEF COMPLAINT: Abdominal pain, nausea and vomiting  HPI: Patient is a 27 year old female who is a G4 P1 A2 (1 previous abortion, one previous miscarriage) who is approximately 6 weeks and 5 days pregnant based on last menstrual period of January 29 who presents to the emergency department with several days of nausea, vomiting.  Having diffuse, "severe" abdominal cramps.  Also having a large amount of white vaginal discharge.  No vaginal bleeding.  No diarrhea.  No fever.  No dysuria or hematuria.  Was seen at [facility] 1 week ago for similar symptoms and was treated with Zofran, IV fluids and felt better.  Was given prescription for Zofran for home which she states has not been helping.  States in the last 3 days all she has eaten is a small piece of bread.  OBGYN is Dr. Shawnie Pons.  Patient denies history of sexually transmitted diseases.  ROS: See HPI Constitutional: no fever  Eyes: no drainage  ENT: no runny nose   Cardiovascular:  no chest pain  Resp: no SOB  GI:  + vomiting GU: no dysuria Integumentary: no rash  Allergy: no hives  Musculoskeletal: no leg swelling  Neurological: no slurred speech ROS otherwise negative  PAST MEDICAL HISTORY/PAST SURGICAL HISTORY:  Past Medical History:  Diagnosis Date  . Anxiety   . Depression   . Insomnia   . Nerve disorder   . Panic attacks   . PTSD (post-traumatic stress disorder)     MEDICATIONS:  Prior to Admission medications   Medication Sig Start Date End Date Taking? Authorizing Provider  hydrOXYzine (ATARAX/VISTARIL) 25 MG tablet Take 1 tablet (25 mg total) by mouth every 6 (six) hours. 09/18/16   Cheri Fowler, PA-C  ibuprofen (ADVIL,MOTRIN) 600 MG tablet Take 1 tablet (600 mg total) by mouth every 6 (six) hours as needed. 05/21/17   Arby Barrette, MD  ondansetron (ZOFRAN ODT) 4 MG disintegrating tablet 4mg  ODT q4 hours prn nausea/vomit 12/27/14   Rolan Bucco, MD    ALLERGIES:  No Known  Allergies  SOCIAL HISTORY:  Social History   Tobacco Use  . Smoking status: Current Some Day Smoker    Years: 1.00    Types: Cigars  . Smokeless tobacco: Never Used  Substance Use Topics  . Alcohol use: Yes    Comment: rare    FAMILY HISTORY: No family history on file.  EXAM: BP 127/62 (BP Location: Right Arm)   Pulse 86   Temp 98.3 F (36.8 C) (Oral)   Resp 18   SpO2 100%  CONSTITUTIONAL: Alert and oriented and responds appropriately to questions. Well-appearing; well-nourished HEAD: Normocephalic EYES: Conjunctivae clear, pupils appear equal, EOMI ENT: normal nose; dry mucous membranes NECK: Supple, no meningismus, no nuchal rigidity, no LAD  CARD: RRR; S1 and S2 appreciated; no murmurs, no clicks, no rubs, no gallops RESP: Normal chest excursion without splinting or tachypnea; breath sounds clear and equal bilaterally; no wheezes, no rhonchi, no rales, no hypoxia or respiratory distress, speaking full sentences ABD/GI: Normal bowel sounds; non-distended; soft, mildly tender to palpation diffusely throughout the abdomen, no rebound, no guarding, no peritoneal signs, no hepatosplenomegaly GU:  Normal external genitalia. No lesions, rashes noted. Patient has no vaginal bleeding on exam.  Small amount of thin white vaginal discharge.  No adnexal tenderness, mass or fullness, no cervical motion tenderness. Cervix is not appear friable.  Cervix is closed.  Chaperone present for exam. BACK:  The back appears normal and is  non-tender to palpation, there is no CVA tenderness EXT: Normal ROM in all joints; non-tender to palpation; no edema; normal capillary refill; no cyanosis, no calf tenderness or swelling    SKIN: Normal color for age and race; warm; no rash NEURO: Moves all extremities equally PSYCH: The patient's mood and manner are appropriate. Grooming and personal hygiene are appropriate.  MEDICAL DECISION MAKING: Patient here with abdominal pain, vomiting.  Suspect  hyperemesis gravidarum versus viral gastroenteritis.  I do not think this is appendicitis, colitis, bowel obstruction, cholecystitis, pancreatitis.  Will obtain labs and urine.  We will treat symptomatically with IV fluids, Reglan, Tylenol.  She is complaining of severe lower abdominal pain but her pelvic exam is unremarkable.  Will obtain transvaginal ultrasound in the morning to rule out ectopic.  ED PROGRESS: Urine shows large ketones and protein.  Suspect from dehydration.  She is receiving 2 L IV fluid bolus.  She does have many bacteria but also many squamous cells.  I do not think that she has a urinary tract infection.  Urine is positive for trichomonas.  Will treat empirically for gonorrhea, chlamydia and trichomonas today.  Gonorrhea and Chlamydia cultures pending.  She can follow-up with her OB/GYN for HIV, syphilis and hepatitis screening as needed.  States that she is only sexually active with her fianc.  Instructed her that she will need to inform him that she is positive for trichomonas today and that he will need to be treated as well.  We discussed that they should not engage in any sexual intercourse including oral, anal or vaginal intercourse for at least 1 week after both of them have been treated.   Labs show no leukocytosis.  Potassium slightly low at 3.1.  Will give oral replacement.  Otherwise normal electrolytes, LFTs, creatinine, lipase.  Patient report for her to wait for transvaginal ultrasound at 9 AM rather than coming back to the ER later this afternoon or being transferred to [facility].  Signed out to oncoming ED physician to follow-up on patient's ultrasound results.   6:35 AM  Pt vomited a large amount of yellow nonbloody emesis after receiving azithromycin, Flagyl and oral potassium.  Will give IV potassium and IV Flagyl.  I feel we can hold off on giving any further azithromycin unless her chlamydia comes back positive.  Will give dose of IV Zofran to help with  her hyperemesis gravidarum.  I feel benefit of this medication outweighs any potential risk.  She has already received Reglan and Phenergan as a category C.   I reviewed all nursing notes, vitals, pertinent previous records, EKGs, lab and urine results, imaging (as available).          Ward, Layla MawKristen N, DO 01/05/18 [phone]

## 2018-01-06 LAB — GC/CHLAMYDIA PROBE AMP (~~LOC~~) NOT AT ARMC
Chlamydia: NEGATIVE
NEISSERIA GONORRHEA: NEGATIVE

## 2018-06-25 ENCOUNTER — Encounter (HOSPITAL_BASED_OUTPATIENT_CLINIC_OR_DEPARTMENT_OTHER): Payer: Self-pay

## 2018-06-25 ENCOUNTER — Other Ambulatory Visit: Payer: Self-pay

## 2018-06-25 ENCOUNTER — Emergency Department (HOSPITAL_BASED_OUTPATIENT_CLINIC_OR_DEPARTMENT_OTHER)
Admission: EM | Admit: 2018-06-25 | Discharge: 2018-06-25 | Disposition: A | Discharge status: Home / Self Care | Payer: Medicaid Other | Attending: Emergency Medicine | Admitting: Emergency Medicine

## 2018-06-25 DIAGNOSIS — Y92481 Parking lot as the place of occurrence of the external cause: Secondary | ICD-10-CM | POA: Insufficient documentation

## 2018-06-25 DIAGNOSIS — Z79899 Other long term (current) drug therapy: Secondary | ICD-10-CM | POA: Insufficient documentation

## 2018-06-25 DIAGNOSIS — F17290 Nicotine dependence, other tobacco product, uncomplicated: Secondary | ICD-10-CM | POA: Diagnosis not present

## 2018-06-25 DIAGNOSIS — Y9389 Activity, other specified: Secondary | ICD-10-CM | POA: Diagnosis not present

## 2018-06-25 DIAGNOSIS — Y999 Unspecified external cause status: Secondary | ICD-10-CM | POA: Diagnosis not present

## 2018-06-25 DIAGNOSIS — S161XXA Strain of muscle, fascia and tendon at neck level, initial encounter: Secondary | ICD-10-CM | POA: Insufficient documentation

## 2018-06-25 DIAGNOSIS — S199XXA Unspecified injury of neck, initial encounter: Secondary | ICD-10-CM | POA: Diagnosis present
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
## 2018-07-18 ENCOUNTER — Emergency Department (HOSPITAL_BASED_OUTPATIENT_CLINIC_OR_DEPARTMENT_OTHER)
Admission: EM | Admit: 2018-07-18 | Discharge: 2018-07-18 | Disposition: A | Discharge status: Home / Self Care | Payer: Medicaid Other | Attending: Emergency Medicine | Admitting: Emergency Medicine

## 2018-07-18 ENCOUNTER — Emergency Department (HOSPITAL_BASED_OUTPATIENT_CLINIC_OR_DEPARTMENT_OTHER): Payer: Medicaid Other

## 2018-07-18 ENCOUNTER — Other Ambulatory Visit: Payer: Self-pay

## 2018-07-18 ENCOUNTER — Encounter (HOSPITAL_BASED_OUTPATIENT_CLINIC_OR_DEPARTMENT_OTHER): Payer: Self-pay | Admitting: *Deleted

## 2018-07-18 DIAGNOSIS — M79605 Pain in left leg: Secondary | ICD-10-CM | POA: Diagnosis present

## 2018-07-18 DIAGNOSIS — F17290 Nicotine dependence, other tobacco product, uncomplicated: Secondary | ICD-10-CM | POA: Insufficient documentation

## 2018-07-18 DIAGNOSIS — Z79899 Other long term (current) drug therapy: Secondary | ICD-10-CM | POA: Insufficient documentation

## 2018-07-18 NOTE — Discharge Instructions (Signed)
Ice and elevate as needed.  You can use Aleve or Tylenol as needed for pain.  Make sure you have a comfortable shoe that is not pushing up against there as well as trying a compression stocking when you are at work.  If you start to notice more pain in the upper part of your leg, behind her knee or in your thigh or noticed any swelling in those areas return for further evaluation

## 2018-07-18 NOTE — ED Triage Notes (Signed)
Pt reports left lower shin area swelling x 3 weeks, denies injury or trauma, amb to triage with quick steady gait in nad.
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
## 2019-07-07 ENCOUNTER — Emergency Department (HOSPITAL_COMMUNITY)
Admission: EM | Admit: 2019-07-07 | Discharge: 2019-07-07 | Disposition: A | Discharge status: Home / Self Care | Payer: Medicaid Other | Attending: Emergency Medicine | Admitting: Emergency Medicine

## 2019-07-07 ENCOUNTER — Encounter (HOSPITAL_COMMUNITY): Payer: Self-pay

## 2019-07-07 DIAGNOSIS — R197 Diarrhea, unspecified: Secondary | ICD-10-CM | POA: Insufficient documentation

## 2019-07-07 DIAGNOSIS — R112 Nausea with vomiting, unspecified: Secondary | ICD-10-CM | POA: Insufficient documentation

## 2019-07-07 DIAGNOSIS — Z5321 Procedure and treatment not carried out due to patient leaving prior to being seen by health care provider: Secondary | ICD-10-CM | POA: Insufficient documentation

## 2019-07-07 LAB — COMPREHENSIVE METABOLIC PANEL
ALT: 13 U/L (ref 0–44)
AST: 14 U/L — ABNORMAL LOW (ref 15–41)
Albumin: 4.3 g/dL (ref 3.5–5.0)
Alkaline Phosphatase: 77 U/L (ref 38–126)
Anion gap: 12 (ref 5–15)
BUN: 5 mg/dL — ABNORMAL LOW (ref 6–20)
CO2: 23 mmol/L (ref 22–32)
Calcium: 9.5 mg/dL (ref 8.9–10.3)
Chloride: 103 mmol/L (ref 98–111)
Creatinine, Ser: 0.56 mg/dL (ref 0.44–1.00)
GFR calc Af Amer: >60 mL/min (ref >60)
GFR calc non Af Amer: >60 mL/min (ref >60)
Glucose, Bld: 114 mg/dL — ABNORMAL HIGH (ref 70–99)
Potassium: 3.8 mmol/L (ref 3.5–5.1)
Sodium: 138 mmol/L (ref 135–145)
Total Bilirubin: 0.7 mg/dL (ref 0.3–1.2)
Total Protein: 7.7 g/dL (ref 6.5–8.1)

## 2019-07-07 LAB — I-STAT BETA HCG BLOOD, ED (MC, WL, AP ONLY): I-stat hCG, quantitative: >2000 m[IU]/mL — ABNORMAL HIGH (ref <5)

## 2019-07-07 LAB — LIPASE, BLOOD: Lipase: 17 U/L (ref 11–51)

## 2019-07-07 LAB — CBC
HCT: 40 % (ref 36.0–46.0)
Hemoglobin: 13.5 g/dL (ref 12.0–15.0)
MCH: 31 pg (ref 26.0–34.0)
MCHC: 33.8 g/dL (ref 30.0–36.0)
MCV: 92 fL (ref 80.0–100.0)
Platelets: 249 10*3/uL (ref 150–400)
RBC: 4.35 MIL/uL (ref 3.87–5.11)
RDW: 12.6 % (ref 11.5–15.5)
WBC: 6.8 10*3/uL (ref 4.0–10.5)
nRBC: 0 % (ref 0.0–0.2)

## 2019-07-07 MED ORDER — SODIUM CHLORIDE 0.9% FLUSH: 3.0000 mL | Freq: Once | INTRAVENOUS | Status: DC

## 2019-07-07 NOTE — ED Triage Notes (Signed)
Patient arrived via [facility] from home.   C/O abdominal pain N/V and diarrhea X 8 hours.   Possible pregnancy per patient.    A/ox4 Ambulatory with ems   Missed period 3 weeks ago per patient.

## 2019-07-08 ENCOUNTER — Other Ambulatory Visit: Payer: Self-pay

## 2019-07-08 ENCOUNTER — Emergency Department (HOSPITAL_BASED_OUTPATIENT_CLINIC_OR_DEPARTMENT_OTHER)
Admission: EM | Admit: 2019-07-08 | Discharge: 2019-07-08 | Disposition: A | Discharge status: Home / Self Care | Payer: Medicaid Other | Attending: Emergency Medicine | Admitting: Emergency Medicine

## 2019-07-08 ENCOUNTER — Encounter (HOSPITAL_BASED_OUTPATIENT_CLINIC_OR_DEPARTMENT_OTHER): Payer: Self-pay

## 2019-07-08 DIAGNOSIS — O219 Vomiting of pregnancy, unspecified: Secondary | ICD-10-CM | POA: Insufficient documentation

## 2019-07-08 DIAGNOSIS — Z3201 Encounter for pregnancy test, result positive: Secondary | ICD-10-CM

## 2019-07-08 DIAGNOSIS — R109 Unspecified abdominal pain: Secondary | ICD-10-CM | POA: Insufficient documentation

## 2019-07-08 DIAGNOSIS — Z3A01 Less than 8 weeks gestation of pregnancy: Secondary | ICD-10-CM | POA: Insufficient documentation

## 2019-07-08 DIAGNOSIS — O26891 Other specified pregnancy related conditions, first trimester: Secondary | ICD-10-CM | POA: Insufficient documentation

## 2019-07-08 DIAGNOSIS — R112 Nausea with vomiting, unspecified: Secondary | ICD-10-CM

## 2019-07-08 DIAGNOSIS — F17210 Nicotine dependence, cigarettes, uncomplicated: Secondary | ICD-10-CM | POA: Insufficient documentation

## 2019-07-08 LAB — URINALYSIS, ROUTINE W REFLEX MICROSCOPIC
Bilirubin Urine: NEGATIVE
Glucose, UA: NEGATIVE mg/dL
Hgb urine dipstick: NEGATIVE
Ketones, ur: NEGATIVE mg/dL
Leukocytes,Ua: NEGATIVE
Nitrite: NEGATIVE
Protein, ur: NEGATIVE mg/dL
Specific Gravity, Urine: >1.03 — ABNORMAL HIGH (ref 1.005–1.030)
pH: 6 (ref 5.0–8.0)

## 2019-07-08 LAB — CBC WITH DIFFERENTIAL/PLATELET
Abs Immature Granulocytes: 0.02 10*3/uL (ref 0.00–0.07)
Basophils Absolute: 0 10*3/uL (ref 0.0–0.1)
Basophils Relative: 1 %
Eosinophils Absolute: 0 10*3/uL (ref 0.0–0.5)
Eosinophils Relative: 0 %
HCT: 39.8 % (ref 36.0–46.0)
Hemoglobin: 13.5 g/dL (ref 12.0–15.0)
Immature Granulocytes: 0 %
Lymphocytes Relative: 19 %
Lymphs Abs: 1.2 10*3/uL (ref 0.7–4.0)
MCH: 30.5 pg (ref 26.0–34.0)
MCHC: 33.9 g/dL (ref 30.0–36.0)
MCV: 90 fL (ref 80.0–100.0)
Monocytes Absolute: 0.4 10*3/uL (ref 0.1–1.0)
Monocytes Relative: 7 %
Neutro Abs: 4.7 10*3/uL (ref 1.7–7.7)
Neutrophils Relative %: 73 %
Platelets: 256 10*3/uL (ref 150–400)
RBC: 4.42 MIL/uL (ref 3.87–5.11)
RDW: 12.6 % (ref 11.5–15.5)
WBC: 6.4 10*3/uL (ref 4.0–10.5)
nRBC: 0 % (ref 0.0–0.2)

## 2019-07-08 LAB — COMPREHENSIVE METABOLIC PANEL
ALT: 11 U/L (ref 0–44)
AST: 13 U/L — ABNORMAL LOW (ref 15–41)
Albumin: 4.4 g/dL (ref 3.5–5.0)
Alkaline Phosphatase: 76 U/L (ref 38–126)
Anion gap: 12 (ref 5–15)
BUN: 6 mg/dL (ref 6–20)
CO2: 21 mmol/L — ABNORMAL LOW (ref 22–32)
Calcium: 9.7 mg/dL (ref 8.9–10.3)
Chloride: 103 mmol/L (ref 98–111)
Creatinine, Ser: 0.47 mg/dL (ref 0.44–1.00)
GFR calc Af Amer: >60 mL/min (ref >60)
GFR calc non Af Amer: >60 mL/min (ref >60)
Glucose, Bld: 89 mg/dL (ref 70–99)
Potassium: 3.1 mmol/L — ABNORMAL LOW (ref 3.5–5.1)
Sodium: 136 mmol/L (ref 135–145)
Total Bilirubin: 0.7 mg/dL (ref 0.3–1.2)
Total Protein: 7.8 g/dL (ref 6.5–8.1)

## 2019-07-08 LAB — LIPASE, BLOOD: Lipase: 19 U/L (ref 11–51)

## 2019-07-08 LAB — PREGNANCY, URINE: Preg Test, Ur: POSITIVE — AB

## 2019-07-08 MED ORDER — POTASSIUM CHLORIDE ER 10 MEQ PO TBCR: 10.0000 meq | EXTENDED_RELEASE_TABLET | Freq: Every day | ORAL | 0 refills | Status: AC

## 2019-07-08 MED ORDER — PRENATAL 19 PO CHEW: 1.0000 | CHEWABLE_TABLET | Freq: Every day | ORAL | 0 refills | Status: AC

## 2019-07-08 MED ORDER — DOXYLAMINE-PYRIDOXINE 10-10 MG PO TBEC: 1.0000 | DELAYED_RELEASE_TABLET | Freq: Every day | ORAL | 0 refills | Status: AC

## 2019-07-08 MED ORDER — SODIUM CHLORIDE 0.9 % IV BOLUS
1000.0000 mL | Freq: Once | INTRAVENOUS | Status: AC
  Administered 2019-07-08: 17:00:00 1000 mL via INTRAVENOUS

## 2019-07-08 NOTE — Discharge Instructions (Addendum)
You have been seen today for positive pregnancy test. Please read and follow all provided instructions. Return to the emergency room for worsening condition or new concerning symptoms.    1. Medications:  -Prescription to your pharmacy for potassium chloride.  This is a potassium supplement that you should take because your potassium level was low today.  Please take as prescribed. -Prescription also sent for a prenatal vitamin.  Please take this daily as prescribed. -Prescription also sent for doxylamine-pyridoxine.  This is a medication for nausea.  It is safe in pregnancy.  We do not have it in the emergency department so I was unable to give it to you while you were here. You can get the prescription filled and take as needed and prescribed. -Do not take ibuprofen or any anti-inflammatory medications now that we know you are pregnant. Take medications as prescribed. Please review all of the medicines and only take them if you do not have an allergy to them.   2. Treatment: rest, drink plenty of fluids  3. Follow Up: These follow-up with an OB GYN.  I have included the information for Dr. Cletis Media who we have on call today.  You can ultimately follow-up with any OB doctor you would like, this is just one resource for you.  ?

## 2019-07-08 NOTE — ED Triage Notes (Signed)
Pt c/o n/v/d,abd started yesterday-NAD-steady gait

## 2019-07-08 NOTE — ED Notes (Signed)
Per pt request, Jane Black (her mother) was called and updated with plan of care.

## 2019-07-08 NOTE — ED Provider Notes (Signed)
[facility] EMERGENCY DEPARTMENT Provider Note   CSN: [id number] Arrival date & time: 07/08/19  1138     History   Chief Complaint Chief Complaint  Patient presents with  . Emesis    HPI Jane Black is a 28 y.o. female  G4 P1 A2 (1 previous abortion, one previous miscarriage) with past medical history of anxiety, depression, insomnia, PTSD presents emergency department today with chief complaint of abdominal pain, nausea, vomiting x2 days.  Patient states she has generalized abdominal pain.  She has had 6 episodes of nonbloody nonbilious emesis last 24 hours.  Her last LMP was in August.  She is sexually active with one female partner and thinks she might be pregnant.  She has not tried any medications for symptoms prior to arrival. She denies fever, chills, chest pain, shortness of breath, headache, urinary symptoms, diarrhea. History provided by patient with additional history obtained from chart review.       Past Medical History:  Diagnosis Date  . Anxiety   . Depression   . Insomnia   . Nerve disorder   . Panic attacks   . PTSD (post-traumatic stress disorder)     There are no active problems to display for this patient.   Past Surgical History:  Procedure Laterality Date  . THERAPEUTIC ABORTION       OB History    Gravida  4   Para  1   Term      Preterm      AB  2   Living        SAB  2   TAB      Ectopic      Multiple      Live Births               Home Medications    Prior to Admission medications   Medication Sig Start Date End Date Taking? Authorizing Provider  Doxylamine-Pyridoxine 10-10 MG TBEC Take 1 tablet by mouth at bedtime. 07/08/19 08/07/19  Albrizze, Kaitlyn E, PA-C  medroxyPROGESTERone (DEPO-PROVERA) 150 MG/ML injection INJECT 1 ML EVERY 3 MONTHS INTRAMUSCULARLY FOR 90 DAYS 04/21/18   [provider]  potassium chloride (K-DUR) 10 MEQ tablet Take 1 tablet (10 mEq total) by mouth daily for 5 days.  07/08/19 07/13/19  Albrizze, Harley Hallmark, PA-C  Prenatal Vit-Fe Fumarate-FA (PRENATAL 19) tablet Chew 1 tablet by mouth daily. 07/08/19   Albrizze, Harley Hallmark, PA-C    Family History No family history on file.  Social History Social History   Tobacco Use  . Smoking status: Current Some Day Smoker    Years: 1.00    Types: Cigars  . Smokeless tobacco: Never Used  Substance Use Topics  . Alcohol use: Yes    Comment: rare  . Drug use: No     Allergies   Patient has no known allergies.   Review of Systems Review of Systems  Constitutional: Negative for chills and fever.  HENT: Negative for congestion, ear discharge, ear pain, sinus pressure, sinus pain and sore throat.   Eyes: Negative for pain and redness.  Respiratory: Negative for cough and shortness of breath.   Cardiovascular: Negative for chest pain.  Gastrointestinal: Positive for abdominal pain, nausea and vomiting. Negative for constipation and diarrhea.  Genitourinary: Negative for dysuria, hematuria, pelvic pain, vaginal bleeding and vaginal discharge.  Musculoskeletal: Negative for back pain and neck pain.  Skin: Negative for wound.  Neurological: Negative for weakness, numbness and headaches.  Physical Exam Updated Vital Signs BP 127/75 (BP Location: Right Arm)   Pulse 69   Temp 99.1 F (37.3 C) (Oral)   Resp 16   Ht 5\' 6"  (1.676 m)   Wt 85.7 kg   LMP 04/26/2019   SpO2 100%   BMI 30.51 kg/m   Physical Exam Vitals signs and nursing note reviewed.  Constitutional:      General: She is not in acute distress.    Appearance: She is not ill-appearing.  HENT:     Head: Normocephalic and atraumatic.     Right Ear: Tympanic membrane and external ear normal.     Left Ear: Tympanic membrane and external ear normal.     Nose: Nose normal.     Mouth/Throat:     Mouth: Mucous membranes are moist.     Pharynx: Oropharynx is clear.  Eyes:     General: No scleral icterus.       Right eye: No discharge.         Left eye: No discharge.     Extraocular Movements: Extraocular movements intact.     Conjunctiva/sclera: Conjunctivae normal.     Pupils: Pupils are equal, round, and reactive to light.  Neck:     Musculoskeletal: Normal range of motion.     Vascular: No JVD.  Cardiovascular:     Rate and Rhythm: Normal rate and regular rhythm.     Pulses: Normal pulses.          Radial pulses are 2+ on the right side and 2+ on the left side.     Heart sounds: Normal heart sounds.  Pulmonary:     Comments: Lungs clear to auscultation in all fields. Symmetric chest rise. No wheezing, rales, or rhonchi. Abdominal:     Tenderness: There is no right CVA tenderness, left CVA tenderness or guarding.     Comments: Abdomen is soft, non-distended, and non-tender in all quadrants. No rigidity, no guarding. No peritoneal signs.  Musculoskeletal: Normal range of motion.  Skin:    General: Skin is warm and dry.     Capillary Refill: Capillary refill takes less than 2 seconds.  Neurological:     Mental Status: She is oriented to person, place, and time.     GCS: GCS eye subscore is 4. GCS verbal subscore is 5. GCS motor subscore is 6.     Comments: Fluent speech, no facial droop.  Psychiatric:        Behavior: Behavior normal.      ED Treatments / Results  Labs (all labs ordered are listed, but only abnormal results are displayed) Labs Reviewed  URINALYSIS, ROUTINE W REFLEX MICROSCOPIC - Abnormal; Notable for the following components:      Result Value   APPearance HAZY (*)    All other components within normal limits  PREGNANCY, URINE - Abnormal; Notable for the following components:   Preg Test, Ur POSITIVE (*)    All other components within normal limits  COMPREHENSIVE METABOLIC PANEL - Abnormal; Notable for the following components:   Potassium 3.1 (*)    CO2 21 (*)    AST 13 (*)    All other components within normal limits  LIPASE, BLOOD  CBC WITH DIFFERENTIAL/PLATELET    EKG None   Radiology No results found.  Procedures Procedures (including critical care time)  Medications Ordered in ED Medications  sodium chloride 0.9 % bolus 1,000 mL (0 mLs Intravenous Stopped 07/08/19 1830)     Initial Impression /  Assessment and Plan / ED Course  I have reviewed the triage vital signs and the nursing notes.  Pertinent labs & imaging results that were available during my care of the patient were reviewed by me and considered in my medical decision making (see chart for details).  Patient seen and examined. Patient nontoxic appearing, in no apparent distress, vitals WNL.  She is resting comfortably on stretcher.  On exam lungs are clear to auscultation all fields, abdomen is nontender, no peritoneal signs.  Labs are significant for positive pregnancy test, hypokalemia 3.1.  No leukocytosis or anemia, face is within normal range.  UA is without signs of infection.  Patient given 1 L of IV fluids.  Case discussed with ED pharmacist Hank who does not recommend nausea medication as she is so early pregnancy and likely just is not available in this hospital pharmacy. I evaluated patient after she received IV fluids and she reports feeling better.  Abdomen continues to be benign.  She tolerated sips of p.o. intake.  With her reassuring work-up and normal vital signs ectopic is extremely unlikely.  Engaged in shared decision making with patient and she is comfortable being discharged home with close OB follow-up.  Will discharge with prescription for short course of potassium and Diclegis as well as prenatal vitamins.  Pt aware she will need potassium rechecked by primary care provider or OB in 1 week. The patient appears reasonably screened and/or stabilized for discharge and I doubt any other medical condition or other [facility] requiring further screening, evaluation, or treatment in the ED at this time prior to discharge. The patient is safe for discharge with strict return precautions discussed.     Portions of this note were generated with Scientist, clinical (histocompatibility and immunogenetics). Dictation errors may occur despite best attempts at proofreading.    Final Clinical Impressions(s) / ED Diagnoses   Final diagnoses:  Positive pregnancy test  Non-intractable vomiting with nausea, unspecified vomiting type    ED Discharge Orders         Ordered    potassium chloride (K-DUR) 10 MEQ tablet  Daily     07/08/19 1846    Prenatal Vit-Fe Fumarate-FA (PRENATAL 19) tablet  Daily     07/08/19 1846    Doxylamine-Pyridoxine 10-10 MG TBEC  Daily at bedtime     07/08/19 1846           Albrizze, [place], PA-C 07/08/19 1859    Arby Barrette, MD 07/09/19 1410

## 2019-07-10 ENCOUNTER — Other Ambulatory Visit: Payer: Self-pay | Admitting: Advanced Practice Midwife

## 2019-07-10 ENCOUNTER — Encounter (HOSPITAL_COMMUNITY): Payer: Self-pay

## 2019-07-10 ENCOUNTER — Inpatient Hospital Stay (HOSPITAL_COMMUNITY)
Admission: AD | Admit: 2019-07-10 | Discharge: 2019-07-10 | Disposition: A | Discharge status: Home / Self Care | Payer: Self-pay | Attending: Obstetrics & Gynecology | Admitting: Obstetrics & Gynecology

## 2019-07-10 ENCOUNTER — Inpatient Hospital Stay (HOSPITAL_COMMUNITY): Payer: Self-pay

## 2019-07-10 ENCOUNTER — Other Ambulatory Visit: Payer: Self-pay

## 2019-07-10 DIAGNOSIS — Z3A10 10 weeks gestation of pregnancy: Secondary | ICD-10-CM | POA: Insufficient documentation

## 2019-07-10 DIAGNOSIS — Z87891 Personal history of nicotine dependence: Secondary | ICD-10-CM | POA: Insufficient documentation

## 2019-07-10 DIAGNOSIS — R109 Unspecified abdominal pain: Secondary | ICD-10-CM | POA: Insufficient documentation

## 2019-07-10 DIAGNOSIS — Z3491 Encounter for supervision of normal pregnancy, unspecified, first trimester: Secondary | ICD-10-CM

## 2019-07-10 DIAGNOSIS — O26891 Other specified pregnancy related conditions, first trimester: Secondary | ICD-10-CM | POA: Insufficient documentation

## 2019-07-10 DIAGNOSIS — O219 Vomiting of pregnancy, unspecified: Secondary | ICD-10-CM | POA: Insufficient documentation

## 2019-07-10 DIAGNOSIS — O26899 Other specified pregnancy related conditions, unspecified trimester: Secondary | ICD-10-CM

## 2019-07-10 LAB — URINALYSIS, ROUTINE W REFLEX MICROSCOPIC
Bacteria, UA: NONE SEEN
Bilirubin Urine: NEGATIVE
Glucose, UA: NEGATIVE mg/dL
Hgb urine dipstick: NEGATIVE
Ketones, ur: 80 mg/dL — AB
Nitrite: NEGATIVE
Protein, ur: 100 mg/dL — AB
Specific Gravity, Urine: 1.035 — ABNORMAL HIGH (ref 1.005–1.030)
Squamous Epithelial / HPF: >50 — ABNORMAL HIGH (ref 0–5)
pH: 5 (ref 5.0–8.0)

## 2019-07-10 LAB — ABO/RH: ABO/RH(D): A POS

## 2019-07-10 LAB — CBC
HCT: 38 % (ref 36.0–46.0)
Hemoglobin: 13.8 g/dL (ref 12.0–15.0)
MCH: 32.3 pg (ref 26.0–34.0)
MCHC: 36.3 g/dL — ABNORMAL HIGH (ref 30.0–36.0)
MCV: 89 fL (ref 80.0–100.0)
Platelets: 253 10*3/uL (ref 150–400)
RBC: 4.27 MIL/uL (ref 3.87–5.11)
RDW: 12.5 % (ref 11.5–15.5)
WBC: 7.8 10*3/uL (ref 4.0–10.5)
nRBC: 0 % (ref 0.0–0.2)

## 2019-07-10 LAB — WET PREP, GENITAL
Sperm: NONE SEEN
Trich, Wet Prep: NONE SEEN
Yeast Wet Prep HPF POC: NONE SEEN

## 2019-07-10 LAB — HCG, QUANTITATIVE, PREGNANCY: hCG, Beta Chain, Quant, S: 40812 m[IU]/mL — ABNORMAL HIGH (ref <5)

## 2019-07-10 MED ORDER — ONDANSETRON 4 MG PO TBDP: 4.0000 mg | ORAL_TABLET | Freq: Three times a day (TID) | ORAL | 0 refills | Status: AC | PRN

## 2019-07-10 MED ORDER — PROMETHAZINE HCL 25 MG/ML IJ SOLN
25.0000 mg | Freq: Once | INTRAMUSCULAR | Status: AC
  Administered 2019-07-10: 25 mg via INTRAVENOUS
  Filled 2019-07-10: qty 1

## 2019-07-10 MED ORDER — SCOPOLAMINE 1 MG/3DAYS TD PT72
1.0000 | MEDICATED_PATCH | TRANSDERMAL | Status: DC
  Administered 2019-07-10: 1.5 mg via TRANSDERMAL
  Filled 2019-07-10: qty 1

## 2019-07-10 MED ORDER — DEXAMETHASONE SODIUM PHOSPHATE 10 MG/ML IJ SOLN
10.0000 mg | Freq: Once | INTRAMUSCULAR | Status: AC
  Administered 2019-07-10: 10 mg via INTRAVENOUS
  Filled 2019-07-10: qty 1

## 2019-07-10 MED ORDER — SODIUM CHLORIDE 0.9 % IV SOLN
25.0000 mg | Freq: Once | INTRAVENOUS | Status: AC
  Administered 2019-07-10: 25 mg via INTRAVENOUS
  Filled 2019-07-10: qty 1

## 2019-07-10 MED ORDER — PROMETHAZINE HCL 25 MG PO TABS: 25.0000 mg | ORAL_TABLET | Freq: Four times a day (QID) | ORAL | 0 refills | Status: AC | PRN

## 2019-07-10 MED ORDER — ONDANSETRON HCL 4 MG/2ML IJ SOLN
4.0000 mg | Freq: Four times a day (QID) | INTRAMUSCULAR | Status: DC | PRN
  Administered 2019-07-10: 4 mg via INTRAVENOUS
  Filled 2019-07-10: qty 2

## 2019-07-10 MED ORDER — LACTATED RINGERS IV BOLUS
1000.0000 mL | Freq: Once | INTRAVENOUS | Status: AC
  Administered 2019-07-10: 1000 mL via INTRAVENOUS

## 2019-07-10 MED ORDER — FAMOTIDINE IN NACL 20-0.9 MG/50ML-% IV SOLN
20.0000 mg | Freq: Once | INTRAVENOUS | Status: AC
  Administered 2019-07-10: 20 mg via INTRAVENOUS
  Filled 2019-07-10: qty 50

## 2019-07-10 MED ORDER — FAMOTIDINE 20 MG PO TABS: 20.0000 mg | ORAL_TABLET | Freq: Two times a day (BID) | ORAL | 0 refills | Status: AC

## 2019-07-10 MED ORDER — SODIUM CHLORIDE 0.9 % IV SOLN
10.0000 mg | Freq: Once | INTRAVENOUS | Status: DC
  Filled 2019-07-10: qty 1

## 2019-07-10 NOTE — Discharge Instructions (Signed)
Hyperemesis Gravidarum °Hyperemesis gravidarum is a severe form of nausea and vomiting that happens during pregnancy. Hyperemesis is worse than morning sickness. It may cause you to have nausea or vomiting all day for many days. It may keep you from eating and drinking enough food and liquids, which can lead to dehydration, malnutrition, and weight loss. Hyperemesis usually occurs during the first half (the first 20 weeks) of pregnancy. It often goes away once a woman is in her second half of pregnancy. However, sometimes hyperemesis continues through an entire pregnancy. °What are the causes? °The cause of this condition is not known. It may be related to changes in chemicals (hormones) in the body during pregnancy, such as the high level of pregnancy hormone (human chorionic gonadotropin) or the increase in the female sex hormone (estrogen). °What are the signs or symptoms? °Symptoms of this condition include: °· Nausea that does not go away. °· Vomiting that does not allow you to keep any food down. °· Weight loss. °· Body fluid loss (dehydration). °· Having no desire to eat, or not liking food that you have previously enjoyed. °How is this diagnosed? °This condition may be diagnosed based on: °· A physical exam. °· Your medical history. °· Your symptoms. °· Blood tests. °· Urine tests. °How is this treated? °This condition is managed by controlling symptoms. This may include: °· Following an eating plan. This can help lessen nausea and vomiting. °· Taking prescription medicines. °An eating plan and medicines are often used together to help control symptoms. If medicines do not help relieve nausea and vomiting, you may need to receive fluids through an IV at the hospital. °Follow these instructions at home: °Eating and drinking ° °· Avoid the following: °? Drinking fluids with meals. Try not to drink anything during the 30 minutes before and after your meals. °? Drinking more than 1 cup of fluid at a  time. °? Eating foods that trigger your symptoms. These may include spicy foods, coffee, high-fat foods, very sweet foods, and acidic foods. °? Skipping meals. Nausea can be more intense on an empty stomach. If you cannot tolerate food, do not force it. Try sucking on ice chips or other frozen items and make up for missed calories later. °? Lying down within 2 hours after eating. °? Being exposed to environmental triggers. These may include food smells, smoky rooms, closed spaces, rooms with strong smells, warm or humid places, overly loud and noisy rooms, and rooms with motion or flickering lights. Try eating meals in a well-ventilated area that is free of strong smells. °? Quick and sudden changes in your movement. °? Taking iron pills and multivitamins that contain iron. If you take prescription iron pills, do not stop taking them unless your health care provider approves. °? Preparing food. The smell of food can spoil your appetite or trigger nausea. °· To help relieve your symptoms: °? Listen to your body. Everyone is different and has different preferences. Find what works best for you. °? Eat and drink slowly. °? Eat 5-6 small meals daily instead of 3 large meals. Eating small meals and snacks can help you avoid an empty stomach. °? In the morning, before getting out of bed, eat a couple of crackers to avoid moving around on an empty stomach. °? Try eating starchy foods as these are usually tolerated well. Examples include cereal, toast, bread, potatoes, pasta, rice, and pretzels. °? Include at least 1 serving of protein with your meals and snacks. Protein options include   lean meats, poultry, seafood, beans, nuts, nut butters, eggs, cheese, and yogurt. °? Try eating a protein-rich snack before bed. Examples of a protein-rick snack include cheese and crackers or a peanut butter sandwich made with 1 slice of whole-wheat bread and 1 tsp (5 g) of peanut butter. °? Eat or suck on things that have ginger in them.  It may help relieve nausea. Add ¼ tsp ground ginger to hot tea or choose ginger tea. °? Try drinking 100% fruit juice or an electrolyte drink. An electrolyte drink contains sodium, potassium, and chloride. °? Drink fluids that are cold, clear, and carbonated or sour. Examples include lemonade, ginger ale, lemon-lime soda, ice water, and sparkling water. °? Brush your teeth or use a mouth rinse after meals. °? Talk with your health care provider about starting a supplement of vitamin B6. °General instructions °· Take over-the-counter and prescription medicines only as told by your health care provider. °· Follow instructions from your health care provider about eating or drinking restrictions. °· Continue to take your prenatal vitamins as told by your health care provider. If you are having trouble taking your prenatal vitamins, talk with your health care provider about different options. °· Keep all follow-up and pre-birth (prenatal) visits as told by your health care provider. This is important. °Contact a health care provider if: °· You have pain in your abdomen. °· You have a severe headache. °· You have vision problems. °· You are losing weight. °· You feel weak or dizzy. °Get help right away if: °· You cannot drink fluids without vomiting. °· You vomit blood. °· You have constant nausea and vomiting. °· You are very weak. °· You faint. °· You have a fever and your symptoms suddenly get worse. °Summary °· Hyperemesis gravidarum is a severe form of nausea and vomiting that happens during pregnancy. °· Making some changes to your eating habits may help relieve nausea and vomiting. °· This condition may be managed with medicine. °· If medicines do not help relieve nausea and vomiting, you may need to receive fluids through an IV at the hospital. °This information is not intended to replace advice given to you by your health care provider. Make sure you discuss any questions you have with your health care  provider. °Document Released: 10/08/2005 Document Revised: 10/28/2017 Document Reviewed: 06/06/2016 °Elsevier Patient Education © 2020 [employer] ° ° °

## 2019-07-10 NOTE — MAU Note (Signed)
[redacted] weeks pregnant and having vomiting x 3 days.  Can't keep anything down, not even water.   No bleeding. Abdominal pain around belly button, started at 9 pm, achey, constant.

## 2019-07-10 NOTE — MAU Provider Note (Addendum)
Chief Complaint: Emesis During Pregnancy and Abdominal Pain   First Provider Initiated Contact with Patient 07/10/19 0241        SUBJECTIVE HPI: Jane Black is a 28 y.o. [id number] at 3679w5d by LMP who presents to maternity admissions reporting nausea and vomiting for the past 3 days.  Also has been having pain around the umbilicus.  No bleeding. She denies vaginal bleeding, vaginal itching/burning, urinary symptoms, h/a, dizziness, or fever/chills.    RN Note: [redacted] weeks pregnant and having vomiting x 3 days.  Can't keep anything down, not even water.   No bleeding. Abdominal pain around belly button, started at 9 pm, achey, constant    Past Medical History:  Diagnosis Date  . Anxiety   . Depression   . Insomnia   . Nerve disorder   . Panic attacks   . PTSD (post-traumatic stress disorder)    Past Surgical History:  Procedure Laterality Date  . THERAPEUTIC ABORTION     Social History   Socioeconomic History  . Marital status: Single    Spouse name: Not on file  . Number of children: Not on file  . Years of education: Not on file  . Highest education level: Not on file  Occupational History  . Not on file  Social Needs  . Financial resource strain: Not on file  . Food insecurity    Worry: Not on file    Inability: Not on file  . Transportation needs    Medical: Not on file    Non-medical: Not on file  Tobacco Use  . Smoking status: Former Smoker    Years: 1.00    Types: Cigars    Quit date: 05/31/2019    Years since quitting: 0.1  . Smokeless tobacco: Never Used  Substance and Sexual Activity  . Alcohol use: Yes    Comment: rare  . Drug use: No  . Sexual activity: Yes    Birth control/protection: None  Lifestyle  . Physical activity    Days per week: Not on file    Minutes per session: Not on file  . Stress: Not on file  Relationships  . Social Musicianconnections    Talks on phone: Not on file    Gets together: Not on file    Attends religious service: Not on  file    Active member of club or organization: Not on file    Attends meetings of clubs or organizations: Not on file    Relationship status: Not on file  . Intimate partner violence    Fear of current or ex partner: Not on file    Emotionally abused: Not on file    Physically abused: Not on file    Forced sexual activity: Not on file  Other Topics Concern  . Not on file  Social History Narrative  . Not on file   No current facility-administered medications on file prior to encounter.    Current Outpatient Medications on File Prior to Encounter  Medication Sig Dispense Refill  . Doxylamine-Pyridoxine 10-10 MG TBEC Take 1 tablet by mouth at bedtime. 30 tablet 0  . medroxyPROGESTERone (DEPO-PROVERA) 150 MG/ML injection INJECT 1 ML EVERY 3 MONTHS INTRAMUSCULARLY FOR 90 DAYS  4  . potassium chloride (K-DUR) 10 MEQ tablet Take 1 tablet (10 mEq total) by mouth daily for 5 days. 5 tablet 0  . Prenatal Vit-Fe Fumarate-FA (PRENATAL 19) tablet Chew 1 tablet by mouth daily. 30 tablet 0   No Known Allergies  I have  reviewed patient's Past Medical Hx, Surgical Hx, Family Hx, Social Hx, medications and allergies.   ROS:  Review of Systems  Constitutional: Negative for chills and fever.  Respiratory: Negative for shortness of breath.   Gastrointestinal: Positive for abdominal pain, nausea and vomiting. Negative for constipation and diarrhea.  Genitourinary: Negative for pelvic pain, vaginal bleeding and vaginal discharge.   Review of Systems  Other systems negative   Physical Exam  Physical Exam Patient Vitals for the past 24 hrs:  BP Temp Temp src Pulse Resp SpO2  07/10/19 0225 125/64 98.8 F (37.1 C) Oral 82 16 100 %   Constitutional: Well-developed female in no acute distress.  Cardiovascular: normal rate Respiratory: normal effort GI: Abd soft, tender over umbilical region of abdomen. Pos BS x 4 MS: Extremities nontender, no edema, normal ROM Neurologic: Alert and oriented x 4.   GU: Neg CVAT.  LAB RESULTS Results for orders placed or performed during the hospital encounter of 07/10/19 (from the past 24 hour(s))  Urinalysis, Routine w reflex microscopic     Status: Abnormal   Collection Time: 07/10/19  2:25 AM  Result Value Ref Range   Color, Urine AMBER (A) YELLOW   APPearance CLOUDY (A) CLEAR   Specific Gravity, Urine 1.035 (H) 1.005 - 1.030   pH 5.0 5.0 - 8.0   Glucose, UA NEGATIVE NEGATIVE mg/dL   Hgb urine dipstick NEGATIVE NEGATIVE   Bilirubin Urine NEGATIVE NEGATIVE   Ketones, ur 80 (A) NEGATIVE mg/dL   Protein, ur 409100 (A) NEGATIVE mg/dL   Nitrite NEGATIVE NEGATIVE   Leukocytes,Ua MODERATE (A) NEGATIVE   RBC / HPF 0-5 0 - 5 RBC/hpf   WBC, UA 11-20 0 - 5 WBC/hpf   Bacteria, UA NONE SEEN NONE SEEN   Squamous Epithelial / LPF >50 (H) 0 - 5   Mucus PRESENT   Wet prep, genital     Status: Abnormal   Collection Time: 07/10/19  2:43 AM   Specimen: Vaginal  Result Value Ref Range   Yeast Wet Prep HPF POC NONE SEEN NONE SEEN   Trich, Wet Prep NONE SEEN NONE SEEN   Clue Cells Wet Prep HPF POC PRESENT (A) NONE SEEN   WBC, Wet Prep HPF POC MANY (A) NONE SEEN   Sperm NONE SEEN   CBC     Status: Abnormal   Collection Time: 07/10/19  2:59 AM  Result Value Ref Range   WBC 7.8 4.0 - 10.5 K/uL   RBC 4.27 3.87 - 5.11 MIL/uL   Hemoglobin 13.8 12.0 - 15.0 g/dL   HCT 81.138.0 91.436.0 - 78.246.0 %   MCV 89.0 80.0 - 100.0 fL   MCH 32.3 26.0 - 34.0 pg   MCHC 36.3 (H) 30.0 - 36.0 g/dL   RDW 95.612.5 21.311.5 - 08.615.5 %   Platelets 253 150 - 400 K/uL   nRBC 0.0 0.0 - 0.2 %  hCG, quantitative, pregnancy     Status: Abnormal   Collection Time: 07/10/19  2:59 AM  Result Value Ref Range   hCG, Beta Chain, Quant, S 40,812 (H) <5 mIU/mL  ABO/Rh     Status: None   Collection Time: 07/10/19  2:59 AM  Result Value Ref Range   ABO/RH(D)      A POS Performed at [facility], [address] [place], [place] [id number]      IMAGING [place] Ob Comp Less 14 Wks  Result Date:  07/10/2019 CLINICAL DATA:  28 year old female with abdominal pain in the  1st trimester of pregnancy. Quantitative beta HCG pending. Estimated gestational age by LMP 10 weeks 5 days. EXAM: OBSTETRIC <14 WK [place] AND TRANSVAGINAL OB US TECHNIQUE: Both transabdominal and transvaginal ultrasound examinations were performed for complete evaluation of the gestation as well as the maternal uterus, adnexal regions, and pelvic cul-de-sac. Transvaginal technique was performed to assess early pregnancy. COMPARISON:  None relevant. FINDINGS: Intrauterine gestational sac: Single Yolk sac:  Visible Embryo:  Visible Cardiac Activity: Detected Heart Rate: 100 bpm CRL:  2.8 mm   5 w   5 d                  [place] EDC: 03/06/2020 Subchorionic hemorrhage:  None visualized. Maternal uterus/adnexae: The left ovary appears normal measuring 1.6 x 1.5 x 2.3 centimeters. The right ovary is mildly larger measuring 3.3 x 2.3 x 2.0 centimeters, and may contain the corpus luteum. Trace pelvic free fluid. IMPRESSION: Single living IUP demonstrated. No acute maternal findings visualized. Electronically Signed   By: Genevie Ann M.D.   On: 07/10/2019 03:36   US Ob Transvaginal  Result Date: 07/10/2019 CLINICAL DATA:  28 year old female with abdominal pain in the 1st trimester of pregnancy. Quantitative beta HCG pending. Estimated gestational age by LMP 10 weeks 5 days. EXAM: OBSTETRIC <14 WK [place] AND TRANSVAGINAL OB US TECHNIQUE: Both transabdominal and transvaginal ultrasound examinations were performed for complete evaluation of the gestation as well as the maternal uterus, adnexal regions, and pelvic cul-de-sac. Transvaginal technique was performed to assess early pregnancy. COMPARISON:  None relevant. FINDINGS: Intrauterine gestational sac: Single Yolk sac:  Visible Embryo:  Visible Cardiac Activity: Detected Heart Rate: 100 bpm CRL:  2.8 mm   5 w   5 d                  [place] EDC: 03/06/2020 Subchorionic hemorrhage:  None visualized. Maternal  uterus/adnexae: The left ovary appears normal measuring 1.6 x 1.5 x 2.3 centimeters. The right ovary is mildly larger measuring 3.3 x 2.3 x 2.0 centimeters, and may contain the corpus luteum. Trace pelvic free fluid. IMPRESSION: Single living IUP demonstrated. No acute maternal findings visualized. Electronically Signed   By: Genevie Ann M.D.   On: 07/10/2019 03:36     MAU Management/MDM: Ordered usual first trimester r/o ectopic labs.   Genital cultures done Will check baseline Ultrasound to rule out ectopic.  This bleeding/pain can represent a normal pregnancy with bleeding, spontaneous abortion or even an ectopic which can be life-threatening.  The process as listed above helps to determine which of these is present.  Reviewed findings.  Single live IUP seen IV hydration given with Phenergan in bag Decadron and Pepcid also given  Will try sips after infusion Continues to vomit WIll try Scopolamine patch   [place] turned over to oncoming provider  Hansel Feinstein CNM, MSN Certified Nurse-Midwife 07/10/2019  2:42 AM   Patient given IV fluids & IV antiemetics. No longer vomiting. Will d/c home with rx for phenergan & zofran Discussed with the patient the risk of Zofran use in the first trimester of pregnancy. Risks of use in the first trimester include birth defects in babies, specifically cleft lip/palate.  Pt states understanding and plans to use Zofran sparingly.      A: 1. Nausea and vomiting during pregnancy prior to [redacted] weeks gestation   2. Abdominal pain affecting pregnancy, antepartum   3. Normal IUP (intrauterine pregnancy) on prenatal ultrasound, first trimester    P: Discharge home Rx  phenergan & zofran Start prenatal care Reviewed return precautions  Judeth Horn, NP

## 2019-07-11 LAB — CERVICOVAGINAL ANCILLARY ONLY
Chlamydia: NEGATIVE
Neisseria Gonorrhea: NEGATIVE

## 2019-07-11 LAB — CULTURE, OB URINE
Culture: <10000 — AB
Special Requests: NORMAL

## 2019-08-18 IMAGING — US US OB COMP LESS 14 WK
1 series · 14 of 28 positions shown · non-contrast
Comparison: None.

CLINICAL DATA: Pregnant patient with nausea, vomiting and pelvic
pain.

EXAM:
OBSTETRIC <14 WK US AND TRANSVAGINAL OB US
TECHNIQUE: Both transabdominal and transvaginal ultrasound examinations were
performed for complete evaluation of the gestation as well as the
maternal uterus, adnexal regions, and pelvic cul-de-sac.
Transvaginal technique was performed to assess early pregnancy.

[Series 1: us ob comp less 14 wk · 0.24mm/px · 14 of 38 slices shown]
[im 2/38]
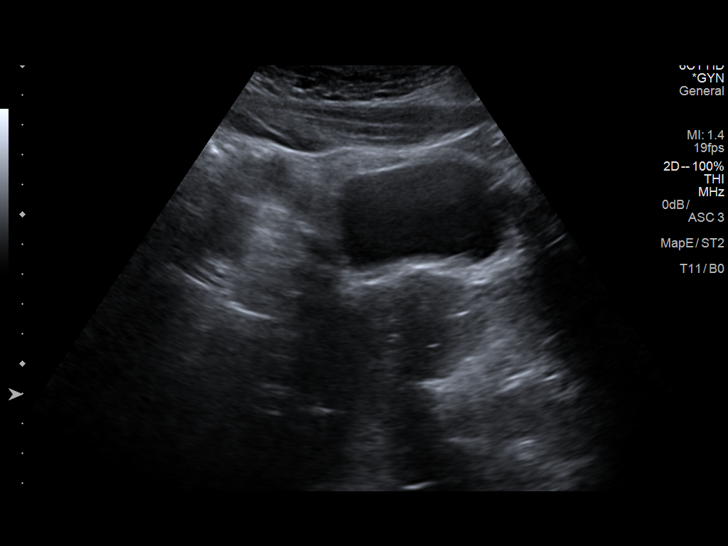
[im 5/38]
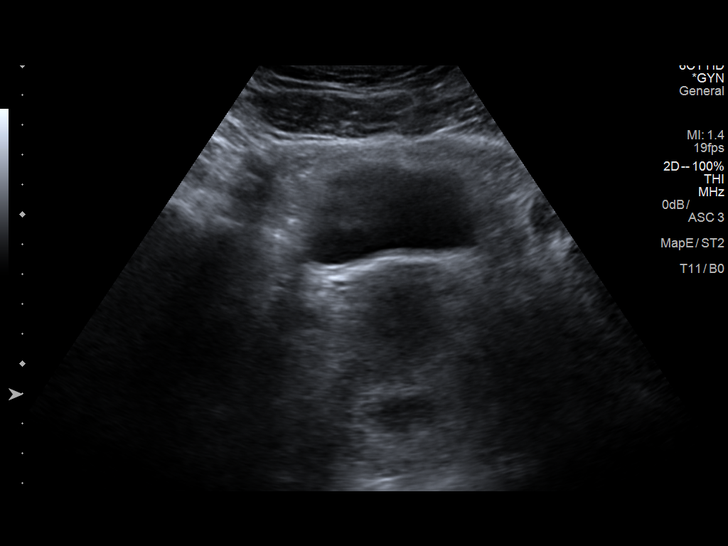
[im 7/38]
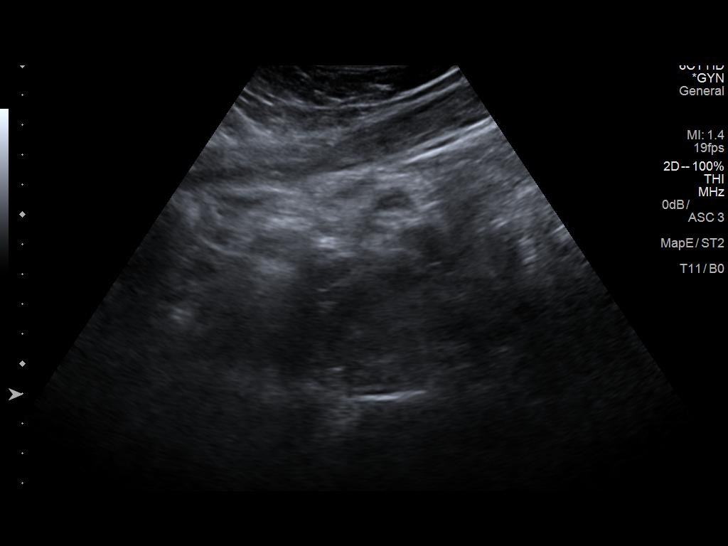
[im 10/38]
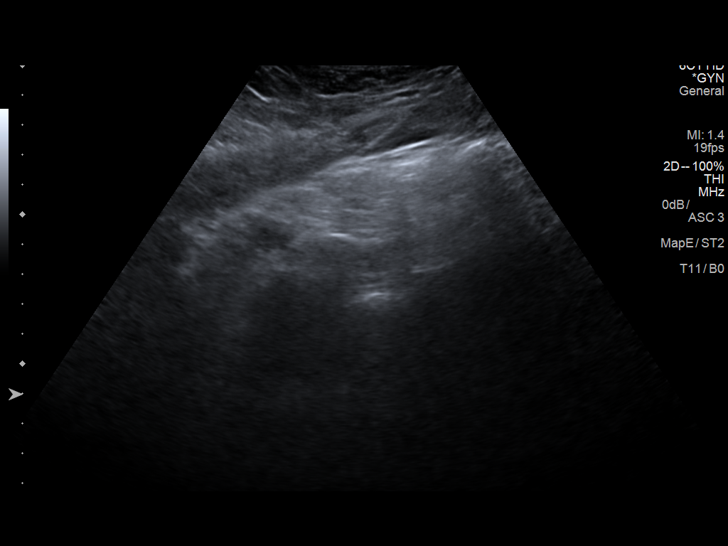
[im 13/38]
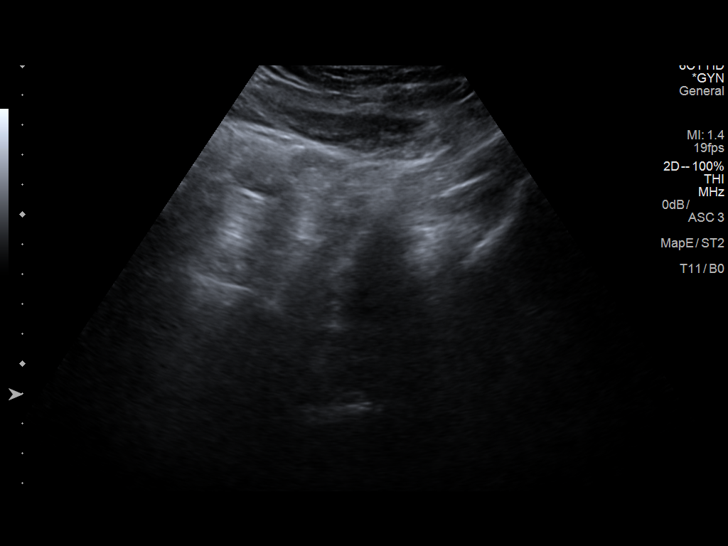
[im 16/38]
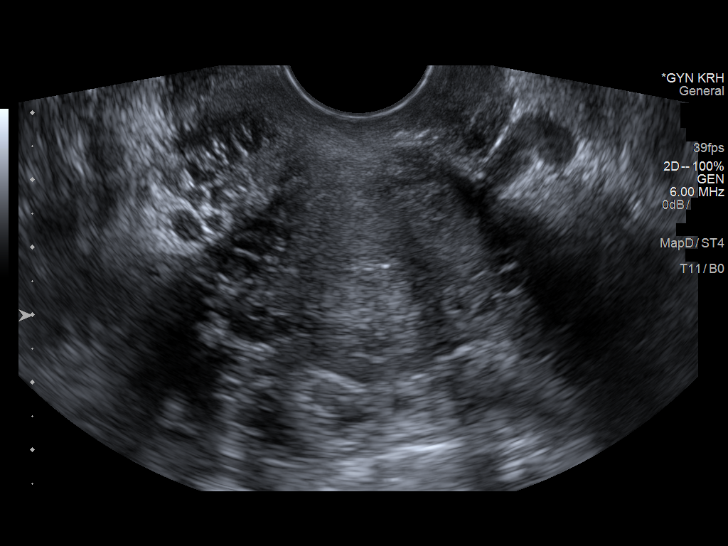
[im 18/38]
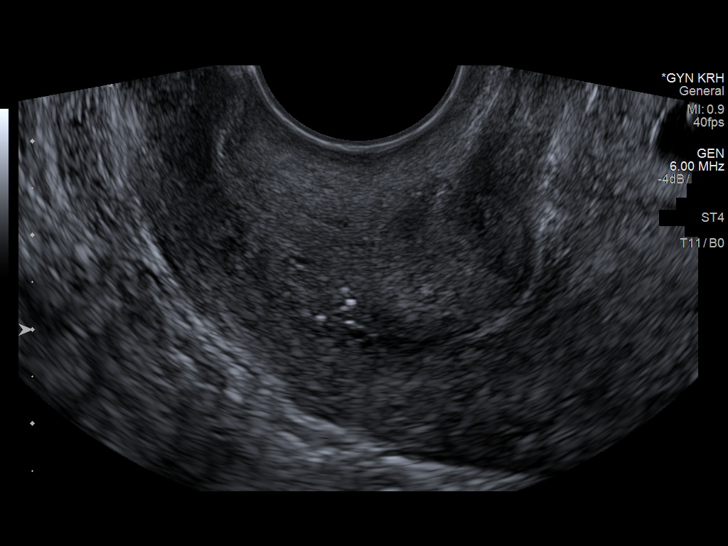
[im 21/38]
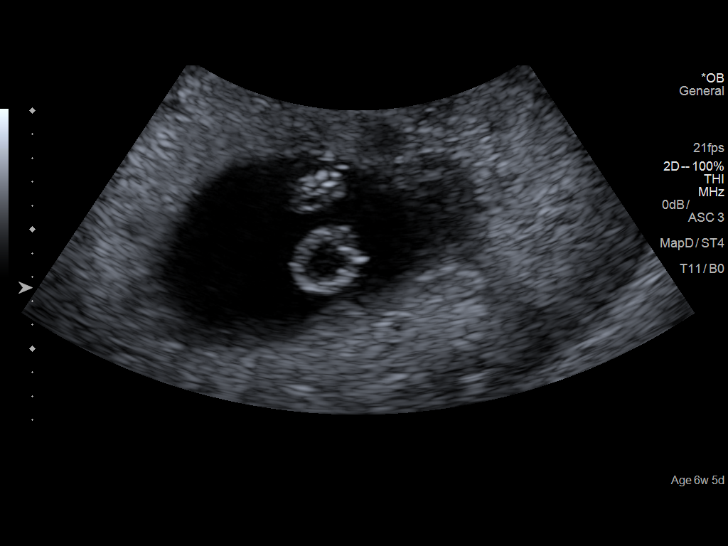
[im 24/38]
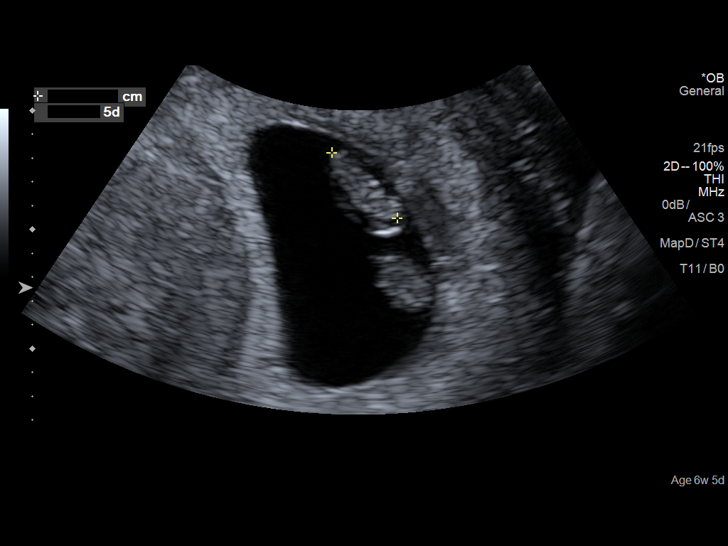
[im 27/38]
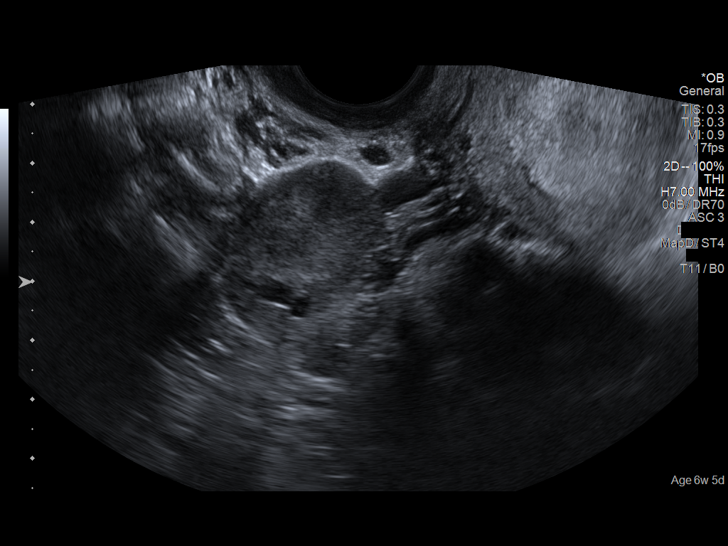
[im 29/38]
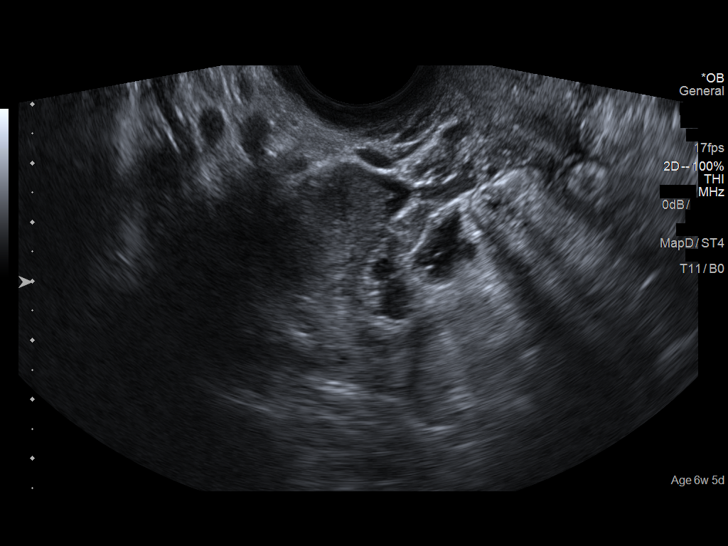
[im 32/38]
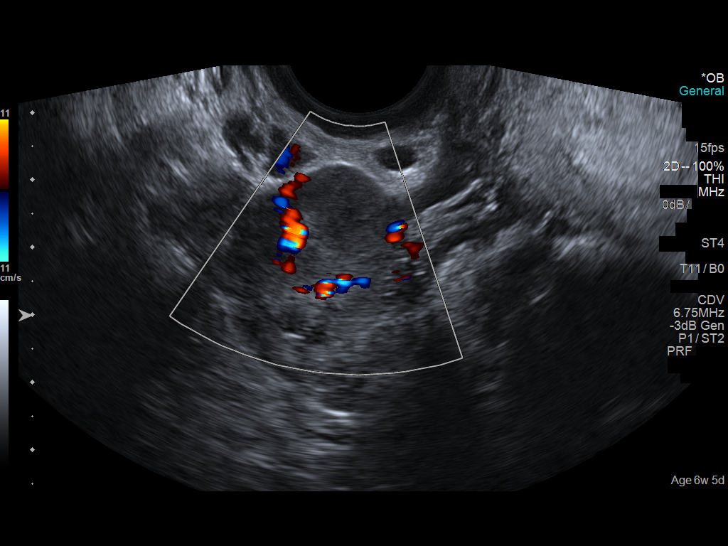
[im 35/38]
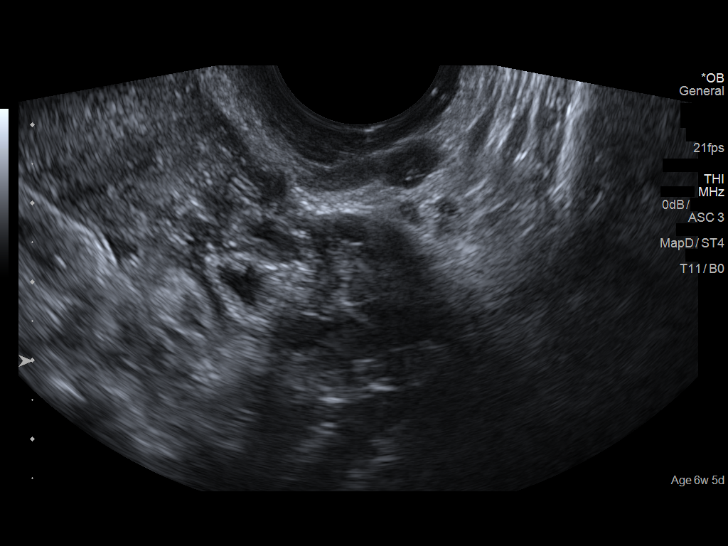
[im 38/38]
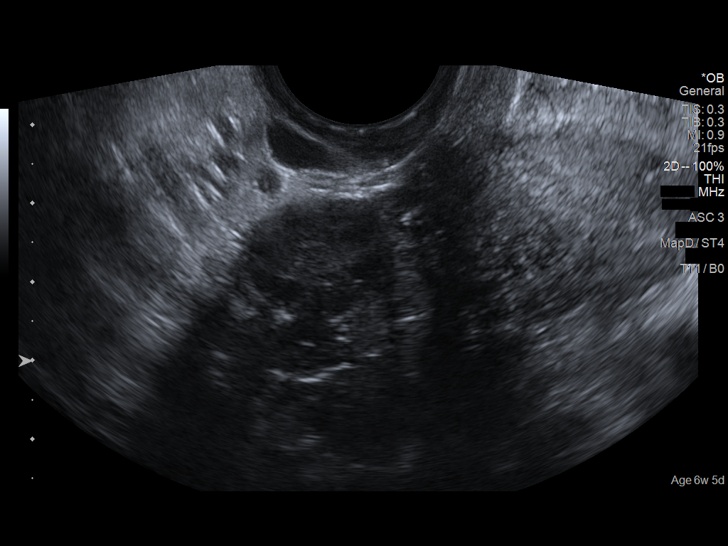

[14 of 28 positions shown; findings below may reference images not displayed]

FINDINGS: Intrauterine gestational sac: Single

Yolk sac:  Visualized.

Embryo:  Visualized.

Cardiac Activity: Visualized.

Heart Rate: 131 bpm

CRL:  8 mm   6 w   5 d                  US EDC: 08/26/2018

Subchorionic hemorrhage:  Small

Maternal uterus/adnexae: Probable corpus luteum right ovary. Left
ovary is normal. Trace free fluid in the pelvis.
IMPRESSION: Single live intrauterine gestation.  Small subchorionic hemorrhage.
